# Patient Record
Sex: Female | Born: 1965 | Race: Asian | Hispanic: No | Smoking: Never smoker
Health system: Southern US, Community
[De-identification: ages and names within clinical notes are randomized; demographics above are authoritative.]

## PROBLEM LIST (undated history)

## (undated) DIAGNOSIS — R0602 Shortness of breath: Secondary | ICD-10-CM

## (undated) DIAGNOSIS — M1990 Unspecified osteoarthritis, unspecified site: Secondary | ICD-10-CM

## (undated) DIAGNOSIS — T783XXA Angioneurotic edema, initial encounter: Secondary | ICD-10-CM

## (undated) DIAGNOSIS — L509 Urticaria, unspecified: Secondary | ICD-10-CM

## (undated) DIAGNOSIS — Z789 Other specified health status: Secondary | ICD-10-CM

## (undated) HISTORY — PX: EYE SURGERY: SHX253

## (undated) HISTORY — PX: HAND SURGERY: SHX662

## (undated) HISTORY — DX: Urticaria, unspecified: L50.9

## (undated) HISTORY — DX: Angioneurotic edema, initial encounter: T78.3XXA

---

## 2006-06-04 ENCOUNTER — Emergency Department (HOSPITAL_COMMUNITY): Admission: EM | Admit: 2006-06-04 | Discharge: 2006-06-04 | Payer: Self-pay | Admitting: Emergency Medicine

## 2010-06-03 ENCOUNTER — Encounter: Admission: RE | Admit: 2010-06-03 | Discharge: 2010-06-03 | Payer: Self-pay | Admitting: Family Medicine

## 2010-06-26 ENCOUNTER — Encounter
Admission: RE | Admit: 2010-06-26 | Discharge: 2010-06-26 | Payer: Self-pay | Source: Home / Self Care | Attending: Family Medicine | Admitting: Family Medicine

## 2010-06-28 ENCOUNTER — Other Ambulatory Visit
Admission: RE | Admit: 2010-06-28 | Discharge: 2010-06-28 | Payer: Self-pay | Source: Home / Self Care | Admitting: Family Medicine

## 2011-05-07 ENCOUNTER — Other Ambulatory Visit: Payer: Self-pay | Admitting: Family Medicine

## 2011-05-07 DIAGNOSIS — N63 Unspecified lump in unspecified breast: Secondary | ICD-10-CM

## 2011-06-06 ENCOUNTER — Ambulatory Visit
Admission: RE | Admit: 2011-06-06 | Discharge: 2011-06-06 | Disposition: A | Discharge status: Home / Self Care | Payer: BC Managed Care – PPO | Source: Ambulatory Visit | Attending: Family Medicine | Admitting: Family Medicine

## 2011-06-06 DIAGNOSIS — N63 Unspecified lump in unspecified breast: Secondary | ICD-10-CM

## 2012-06-16 ENCOUNTER — Other Ambulatory Visit: Payer: Self-pay | Admitting: Family Medicine

## 2012-06-16 DIAGNOSIS — Z1231 Encounter for screening mammogram for malignant neoplasm of breast: Secondary | ICD-10-CM

## 2012-06-18 ENCOUNTER — Ambulatory Visit
Admission: RE | Admit: 2012-06-18 | Discharge: 2012-06-18 | Disposition: A | Discharge status: Home / Self Care | Payer: BC Managed Care – PPO | Source: Ambulatory Visit | Attending: Family Medicine | Admitting: Family Medicine

## 2012-06-18 DIAGNOSIS — Z1231 Encounter for screening mammogram for malignant neoplasm of breast: Secondary | ICD-10-CM

## 2013-08-02 ENCOUNTER — Other Ambulatory Visit: Payer: Self-pay

## 2013-08-02 DIAGNOSIS — Z1231 Encounter for screening mammogram for malignant neoplasm of breast: Secondary | ICD-10-CM

## 2013-08-05 ENCOUNTER — Ambulatory Visit
Admission: RE | Admit: 2013-08-05 | Discharge: 2013-08-05 | Disposition: A | Discharge status: Home / Self Care | Payer: BC Managed Care – PPO | Source: Ambulatory Visit

## 2013-08-05 DIAGNOSIS — Z1231 Encounter for screening mammogram for malignant neoplasm of breast: Secondary | ICD-10-CM

## 2013-11-28 ENCOUNTER — Other Ambulatory Visit: Payer: Self-pay | Admitting: Specialist

## 2013-11-28 DIAGNOSIS — M5030 Other cervical disc degeneration, unspecified cervical region: Secondary | ICD-10-CM

## 2013-11-29 ENCOUNTER — Other Ambulatory Visit: Payer: BC Managed Care – PPO

## 2013-12-02 ENCOUNTER — Ambulatory Visit
Admission: RE | Admit: 2013-12-02 | Discharge: 2013-12-02 | Disposition: A | Discharge status: Home / Self Care | Payer: BC Managed Care – PPO | Source: Ambulatory Visit | Attending: Specialist | Admitting: Specialist

## 2013-12-02 DIAGNOSIS — M5030 Other cervical disc degeneration, unspecified cervical region: Secondary | ICD-10-CM

## 2014-02-15 ENCOUNTER — Other Ambulatory Visit: Payer: Self-pay | Admitting: Neurological Surgery

## 2014-03-21 ENCOUNTER — Encounter (HOSPITAL_COMMUNITY): Payer: Self-pay

## 2014-03-21 ENCOUNTER — Other Ambulatory Visit (HOSPITAL_COMMUNITY): Payer: Self-pay | Admitting: *Deleted

## 2014-03-21 NOTE — Pre-Procedure Instructions (Signed)
Sara Reyes  03/21/2014   Your procedure is scheduled on:  Wednesday, March 29, 2014 at 9:30 AM.   Report to [facility] Entrance "A" Admitting Office at 7:30 AM.   Call this number if you have problems the morning of surgery: [phone]   Remember:   Do not eat food or drink liquids after midnight Tuesday, 03/28/14/   Take these medicines the morning of surgery with A SIP OF WATER: None  Stop Ibuprofen as of today.   Do not wear jewelry, make-up or nail polish.  Do not wear lotions, powders, or perfumes. You may wear deodorant.  Do not shave 48 hours prior to surgery.   Do not bring valuables to the hospital.  [facility] is not responsible                  for any belongings or valuables.               Contacts, dentures or bridgework may not be worn into surgery.  Leave suitcase in the car. After surgery it may be brought to your room.  For patients admitted to the hospital, discharge time is determined by your                treatment team.               Special Instructions: [facility] - Preparing for Surgery  Before surgery, you can play an important role.  Because skin is not sterile, your skin needs to be as free of germs as possible.  You can reduce the number of germs on you skin by washing with CHG (chlorahexidine gluconate) soap before surgery.  CHG is an antiseptic cleaner which kills germs and bonds with the skin to continue killing germs even after washing.  Please DO NOT use if you have an allergy to CHG or antibacterial soaps.  If your skin becomes reddened/irritated stop using the CHG and inform your nurse when you arrive at Short Stay.  Do not shave (including legs and underarms) for at least 48 hours prior to the first CHG shower.  You may shave your face.  Please follow these instructions carefully:   1.  Shower with CHG Soap the night before surgery and the                                morning of Surgery.  2.  If you choose to wash your  hair, wash your hair first as usual with your       normal shampoo.  3.  After you shampoo, rinse your hair and body thoroughly to remove the                      Shampoo.  4.  Use CHG as you would any other liquid soap.  You can apply chg directly       to the skin and wash gently with scrungie or a clean washcloth.  5.  Apply the CHG Soap to your body ONLY FROM THE NECK DOWN.        Do not use on open wounds or open sores.  Avoid contact with your eyes, ears, mouth and genitals (private parts).  Wash genitals (private parts) with your normal soap.  6.  Wash thoroughly, paying special attention to the area where your surgery        will be  performed.  7.  Thoroughly rinse your body with warm water from the neck down.  8.  DO NOT shower/wash with your normal soap after using and rinsing off       the CHG Soap.  9.  Pat yourself dry with a clean towel.            10.  Wear clean pajamas.            11.  Place clean sheets on your bed the night of your first shower and do not        sleep with pets.  Day of Surgery  Do not apply any lotions the morning of surgery.  Please wear clean clothes to the hospital/surgery center.     Please read over the following fact sheets that you were given: Pain Booklet, Coughing and Deep Breathing, MRSA Information and Surgical Site Infection Prevention

## 2014-03-22 ENCOUNTER — Encounter (HOSPITAL_COMMUNITY): Payer: Self-pay

## 2014-03-22 ENCOUNTER — Encounter (HOSPITAL_COMMUNITY)
Admission: RE | Admit: 2014-03-22 | Discharge: 2014-03-22 | Disposition: A | Discharge status: Home / Self Care | Payer: BC Managed Care – PPO | Source: Ambulatory Visit | Attending: Neurological Surgery | Admitting: Neurological Surgery

## 2014-03-22 DIAGNOSIS — Z01812 Encounter for preprocedural laboratory examination: Secondary | ICD-10-CM | POA: Diagnosis present

## 2014-03-22 DIAGNOSIS — M4802 Spinal stenosis, cervical region: Secondary | ICD-10-CM | POA: Diagnosis present

## 2014-03-22 HISTORY — DX: Shortness of breath: R06.02

## 2014-03-22 HISTORY — DX: Other specified health status: Z78.9

## 2014-03-22 HISTORY — DX: Unspecified osteoarthritis, unspecified site: M19.90

## 2014-03-22 LAB — CBC WITH DIFFERENTIAL/PLATELET
BASOS PCT: 1 % (ref 0–1)
Basophils Absolute: 0 10*3/uL (ref 0.0–0.1)
Eosinophils Absolute: 0.3 10*3/uL (ref 0.0–0.7)
Eosinophils Relative: 5 % (ref 0–5)
HCT: 35.1 % — ABNORMAL LOW (ref 36.0–46.0)
Hemoglobin: 11.4 g/dL — ABNORMAL LOW (ref 12.0–15.0)
Lymphocytes Relative: 29 % (ref 12–46)
Lymphs Abs: 1.9 10*3/uL (ref 0.7–4.0)
MCH: 27.1 pg (ref 26.0–34.0)
MCHC: 32.5 g/dL (ref 30.0–36.0)
MCV: 83.4 fL (ref 78.0–100.0)
Monocytes Absolute: 0.6 10*3/uL (ref 0.1–1.0)
Monocytes Relative: 8 % (ref 3–12)
NEUTROS PCT: 57 % (ref 43–77)
Neutro Abs: 3.8 10*3/uL (ref 1.7–7.7)
PLATELETS: 326 10*3/uL (ref 150–400)
RBC: 4.21 MIL/uL (ref 3.87–5.11)
RDW: 13.1 % (ref 11.5–15.5)
WBC: 6.6 10*3/uL (ref 4.0–10.5)

## 2014-03-22 LAB — PROTIME-INR
INR: 1.05 (ref 0.00–1.49)
Prothrombin Time: 13.7 seconds (ref 11.6–15.2)

## 2014-03-22 LAB — SURGICAL PCR SCREEN
MRSA, PCR: NEGATIVE
STAPHYLOCOCCUS AUREUS: POSITIVE — AB

## 2014-03-22 LAB — BASIC METABOLIC PANEL
Anion gap: 10 (ref 5–15)
BUN: 15 mg/dL (ref 6–23)
CO2: 25 mEq/L (ref 19–32)
CREATININE: 0.51 mg/dL (ref 0.50–1.10)
Calcium: 9.1 mg/dL (ref 8.4–10.5)
Chloride: 104 mEq/L (ref 96–112)
Glucose, Bld: 82 mg/dL (ref 70–99)
POTASSIUM: 4.6 meq/L (ref 3.7–5.3)
Sodium: 139 mEq/L (ref 137–147)

## 2014-03-22 LAB — HCG, SERUM, QUALITATIVE: Preg, Serum: NEGATIVE

## 2014-03-22 NOTE — Progress Notes (Signed)
Rx. Called to [facility] on [address].

## 2014-03-28 MED ORDER — DEXAMETHASONE SODIUM PHOSPHATE 10 MG/ML IJ SOLN
10.0000 mg | INTRAMUSCULAR | Status: AC
  Administered 2014-03-29: 10 mg via INTRAVENOUS
  Filled 2014-03-28: qty 1

## 2014-03-29 ENCOUNTER — Encounter (HOSPITAL_COMMUNITY): Payer: Self-pay | Admitting: *Deleted

## 2014-03-29 ENCOUNTER — Encounter (HOSPITAL_COMMUNITY)
Admission: RE | Disposition: A | Discharge status: Home / Self Care | Payer: Self-pay | Source: Ambulatory Visit | Attending: Neurological Surgery

## 2014-03-29 ENCOUNTER — Encounter (HOSPITAL_COMMUNITY): Payer: BC Managed Care – PPO | Admitting: Anesthesiology

## 2014-03-29 ENCOUNTER — Inpatient Hospital Stay (HOSPITAL_COMMUNITY): Payer: BC Managed Care – PPO

## 2014-03-29 ENCOUNTER — Inpatient Hospital Stay (HOSPITAL_COMMUNITY)
Admission: RE | Admit: 2014-03-29 | Discharge: 2014-04-03 | DRG: 473 | Disposition: A | Discharge status: Home / Self Care | Payer: BC Managed Care – PPO | Source: Ambulatory Visit | Attending: Neurological Surgery | Admitting: Neurological Surgery

## 2014-03-29 ENCOUNTER — Inpatient Hospital Stay (HOSPITAL_COMMUNITY): Payer: BC Managed Care – PPO | Admitting: Anesthesiology

## 2014-03-29 DIAGNOSIS — M47812 Spondylosis without myelopathy or radiculopathy, cervical region: Principal | ICD-10-CM | POA: Diagnosis present

## 2014-03-29 DIAGNOSIS — M488X2 Other specified spondylopathies, cervical region: Secondary | ICD-10-CM | POA: Diagnosis present

## 2014-03-29 DIAGNOSIS — M5020 Other cervical disc displacement, unspecified cervical region: Secondary | ICD-10-CM | POA: Diagnosis present

## 2014-03-29 DIAGNOSIS — Z981 Arthrodesis status: Secondary | ICD-10-CM

## 2014-03-29 DIAGNOSIS — M79609 Pain in unspecified limb: Secondary | ICD-10-CM | POA: Diagnosis present

## 2014-03-29 HISTORY — PX: POSTERIOR CERVICAL FUSION/FORAMINOTOMY: SHX5038

## 2014-03-29 SURGERY — POSTERIOR CERVICAL FUSION/FORAMINOTOMY LEVEL 5
Anesthesia: General

## 2014-03-29 MED ORDER — ACETAMINOPHEN 325 MG PO TABS: 650.0000 mg | ORAL_TABLET | ORAL | Status: DC | PRN

## 2014-03-29 MED ORDER — NEOSTIGMINE METHYLSULFATE 10 MG/10ML IV SOLN
INTRAVENOUS | Status: AC
  Filled 2014-03-29: qty 1

## 2014-03-29 MED ORDER — EPHEDRINE SULFATE 50 MG/ML IJ SOLN
INTRAMUSCULAR | Status: AC
Start: 2014-03-29 — End: 2014-03-29
  Filled 2014-03-29: qty 1

## 2014-03-29 MED ORDER — HYDROMORPHONE HCL 1 MG/ML IJ SOLN
INTRAMUSCULAR | Status: AC
  Filled 2014-03-29: qty 1

## 2014-03-29 MED ORDER — STERILE WATER FOR INJECTION IJ SOLN
INTRAMUSCULAR | Status: AC
  Filled 2014-03-29: qty 10

## 2014-03-29 MED ORDER — WHITE PETROLATUM GEL
Status: AC
  Administered 2014-03-29: 18:00:00
  Filled 2014-03-29: qty 5

## 2014-03-29 MED ORDER — POTASSIUM CHLORIDE IN NACL 20-0.9 MEQ/L-% IV SOLN
INTRAVENOUS | Status: DC
  Administered 2014-03-29: 20:00:00 via INTRAVENOUS
  Administered 2014-03-30: 75 mL/h via INTRAVENOUS
  Administered 2014-03-31 – 2014-04-01 (×3): via INTRAVENOUS
  Filled 2014-03-29 (×5): qty 1000

## 2014-03-29 MED ORDER — VECURONIUM BROMIDE 10 MG IV SOLR
INTRAVENOUS | Status: AC
  Filled 2014-03-29: qty 10

## 2014-03-29 MED ORDER — GLYCOPYRROLATE 0.2 MG/ML IJ SOLN
INTRAMUSCULAR | Status: AC
  Filled 2014-03-29: qty 2

## 2014-03-29 MED ORDER — OXYCODONE HCL 5 MG PO TABS
5.0000 mg | ORAL_TABLET | Freq: Once | ORAL | Status: AC | PRN
  Administered 2014-03-29: 5 mg via ORAL

## 2014-03-29 MED ORDER — GLYCOPYRROLATE 0.2 MG/ML IJ SOLN
INTRAMUSCULAR | Status: DC | PRN
  Administered 2014-03-29: 0.2 mg via INTRAVENOUS
  Administered 2014-03-29: 0.1 mg via INTRAVENOUS

## 2014-03-29 MED ORDER — SODIUM CHLORIDE 0.9 % IJ SOLN
INTRAMUSCULAR | Status: AC
  Filled 2014-03-29: qty 10

## 2014-03-29 MED ORDER — VECURONIUM BROMIDE 10 MG IV SOLR
INTRAVENOUS | Status: DC | PRN
  Administered 2014-03-29: 1 mg via INTRAVENOUS
  Administered 2014-03-29: 2 mg via INTRAVENOUS

## 2014-03-29 MED ORDER — KETOROLAC TROMETHAMINE 30 MG/ML IJ SOLN
INTRAMUSCULAR | Status: AC
  Administered 2014-03-29: 15 mg
  Filled 2014-03-29: qty 1

## 2014-03-29 MED ORDER — GLYCOPYRROLATE 0.2 MG/ML IJ SOLN
INTRAMUSCULAR | Status: AC
  Filled 2014-03-29: qty 1

## 2014-03-29 MED ORDER — ACETAMINOPHEN 650 MG RE SUPP
650.0000 mg | RECTAL | Status: DC | PRN
Start: 2014-03-29 — End: 2014-04-03

## 2014-03-29 MED ORDER — LACTATED RINGERS IV SOLN
INTRAVENOUS | Status: DC | PRN
  Administered 2014-03-29 (×2): via INTRAVENOUS

## 2014-03-29 MED ORDER — FENTANYL CITRATE 0.05 MG/ML IJ SOLN
INTRAMUSCULAR | Status: AC
  Filled 2014-03-29: qty 5

## 2014-03-29 MED ORDER — OXYCODONE-ACETAMINOPHEN 5-325 MG PO TABS: 1.0000 | ORAL_TABLET | ORAL | Status: DC | PRN

## 2014-03-29 MED ORDER — OXYCODONE HCL 5 MG PO TABS
ORAL_TABLET | ORAL | Status: AC
  Filled 2014-03-29: qty 1

## 2014-03-29 MED ORDER — SUCCINYLCHOLINE CHLORIDE 20 MG/ML IJ SOLN
INTRAMUSCULAR | Status: DC | PRN
  Administered 2014-03-29: 120 mg via INTRAVENOUS

## 2014-03-29 MED ORDER — METHOCARBAMOL 500 MG PO TABS
500.0000 mg | ORAL_TABLET | Freq: Four times a day (QID) | ORAL | Status: DC | PRN
  Administered 2014-03-29 – 2014-04-03 (×7): 500 mg via ORAL
  Filled 2014-03-29 (×7): qty 1

## 2014-03-29 MED ORDER — THROMBIN 20000 UNITS EX SOLR
CUTANEOUS | Status: DC | PRN
  Administered 2014-03-29: 11:00:00 via TOPICAL

## 2014-03-29 MED ORDER — METHOCARBAMOL 500 MG PO TABS
ORAL_TABLET | ORAL | Status: AC
  Filled 2014-03-29: qty 1

## 2014-03-29 MED ORDER — ONDANSETRON HCL 4 MG/2ML IJ SOLN: 4.0000 mg | Freq: Once | INTRAMUSCULAR | Status: DC | PRN

## 2014-03-29 MED ORDER — CEFAZOLIN SODIUM-DEXTROSE 2-3 GM-% IV SOLR
INTRAVENOUS | Status: AC
  Filled 2014-03-29: qty 50

## 2014-03-29 MED ORDER — OXYCODONE HCL 5 MG/5ML PO SOLN: 5.0000 mg | Freq: Once | ORAL | Status: AC | PRN

## 2014-03-29 MED ORDER — SODIUM CHLORIDE 0.9 % IJ SOLN
3.0000 mL | Freq: Two times a day (BID) | INTRAMUSCULAR | Status: DC
  Administered 2014-03-30 – 2014-04-02 (×4): 3 mL via INTRAVENOUS

## 2014-03-29 MED ORDER — 0.9 % SODIUM CHLORIDE (POUR BTL) OPTIME
TOPICAL | Status: DC | PRN
  Administered 2014-03-29: 1000 mL

## 2014-03-29 MED ORDER — SODIUM CHLORIDE 0.9 % IJ SOLN: 3.0000 mL | INTRAMUSCULAR | Status: DC | PRN

## 2014-03-29 MED ORDER — PHENYLEPHRINE HCL 10 MG/ML IJ SOLN
10.0000 mg | INTRAVENOUS | Status: DC | PRN
  Administered 2014-03-29: 20 ug/min via INTRAVENOUS

## 2014-03-29 MED ORDER — MIDAZOLAM HCL 5 MG/5ML IJ SOLN
INTRAMUSCULAR | Status: DC | PRN
  Administered 2014-03-29: 2 mg via INTRAVENOUS

## 2014-03-29 MED ORDER — ROCURONIUM BROMIDE 50 MG/5ML IV SOLN
INTRAVENOUS | Status: AC
  Filled 2014-03-29: qty 1

## 2014-03-29 MED ORDER — PROPOFOL 10 MG/ML IV BOLUS
INTRAVENOUS | Status: DC | PRN
  Administered 2014-03-29: 20 mg via INTRAVENOUS
  Administered 2014-03-29: 180 mg via INTRAVENOUS
  Administered 2014-03-29: 100 mg via INTRAVENOUS

## 2014-03-29 MED ORDER — PHENOL 1.4 % MT LIQD
1.0000 | OROMUCOSAL | Status: DC | PRN
  Administered 2014-03-29: 1 via OROMUCOSAL
  Filled 2014-03-29: qty 177

## 2014-03-29 MED ORDER — SODIUM CHLORIDE 0.9 % IV SOLN: 250.0000 mL | INTRAVENOUS | Status: DC

## 2014-03-29 MED ORDER — ONDANSETRON HCL 4 MG/2ML IJ SOLN
INTRAMUSCULAR | Status: DC | PRN
Start: 2014-03-29 — End: 2014-03-29
  Administered 2014-03-29: 4 mg via INTRAVENOUS

## 2014-03-29 MED ORDER — METOPROLOL TARTRATE 1 MG/ML IV SOLN
INTRAVENOUS | Status: DC | PRN
  Administered 2014-03-29: 2 mg via INTRAVENOUS

## 2014-03-29 MED ORDER — CEFAZOLIN SODIUM-DEXTROSE 2-3 GM-% IV SOLR: 2.0000 g | INTRAVENOUS | Status: DC

## 2014-03-29 MED ORDER — LIDOCAINE HCL (CARDIAC) 20 MG/ML IV SOLN
INTRAVENOUS | Status: AC
  Filled 2014-03-29: qty 5

## 2014-03-29 MED ORDER — DEXAMETHASONE 4 MG PO TABS
4.0000 mg | ORAL_TABLET | Freq: Four times a day (QID) | ORAL | Status: DC
  Administered 2014-04-01 – 2014-04-03 (×6): 4 mg via ORAL
  Filled 2014-03-29 (×7): qty 1

## 2014-03-29 MED ORDER — DEXTROSE 5 % IV SOLN
INTRAVENOUS | Status: DC | PRN
  Administered 2014-03-29: 10:00:00 via INTRAVENOUS

## 2014-03-29 MED ORDER — DEXAMETHASONE SODIUM PHOSPHATE 4 MG/ML IJ SOLN
4.0000 mg | Freq: Four times a day (QID) | INTRAMUSCULAR | Status: DC
  Administered 2014-03-29 – 2014-04-01 (×13): 4 mg via INTRAVENOUS
  Filled 2014-03-29 (×13): qty 1

## 2014-03-29 MED ORDER — MIDAZOLAM HCL 2 MG/2ML IJ SOLN
INTRAMUSCULAR | Status: AC
  Filled 2014-03-29: qty 2

## 2014-03-29 MED ORDER — THROMBIN 5000 UNITS EX SOLR
OROMUCOSAL | Status: DC | PRN
  Administered 2014-03-29 (×2): via TOPICAL

## 2014-03-29 MED ORDER — HYDROMORPHONE HCL PF 1 MG/ML IJ SOLN
0.2500 mg | INTRAMUSCULAR | Status: DC | PRN
Start: 2014-03-29 — End: 2014-03-29
  Administered 2014-03-29 (×4): 0.5 mg via INTRAVENOUS

## 2014-03-29 MED ORDER — LIDOCAINE HCL (CARDIAC) 20 MG/ML IV SOLN
INTRAVENOUS | Status: DC | PRN
  Administered 2014-03-29: 40 mg via INTRAVENOUS

## 2014-03-29 MED ORDER — BACITRACIN 50000 UNITS IM SOLR
INTRAMUSCULAR | Status: DC | PRN
  Administered 2014-03-29: 11:00:00

## 2014-03-29 MED ORDER — HYDROMORPHONE HCL 1 MG/ML IJ SOLN
INTRAMUSCULAR | Status: AC
  Administered 2014-03-29: 0.5 mg via INTRAVENOUS
  Filled 2014-03-29: qty 1

## 2014-03-29 MED ORDER — PROPOFOL 10 MG/ML IV BOLUS
INTRAVENOUS | Status: AC
  Filled 2014-03-29: qty 20

## 2014-03-29 MED ORDER — FENTANYL CITRATE 0.05 MG/ML IJ SOLN
INTRAMUSCULAR | Status: DC | PRN
  Administered 2014-03-29 (×8): 50 ug via INTRAVENOUS
  Administered 2014-03-29: 100 ug via INTRAVENOUS

## 2014-03-29 MED ORDER — LACTATED RINGERS IV SOLN
INTRAVENOUS | Status: DC
  Administered 2014-03-29: 08:00:00 via INTRAVENOUS

## 2014-03-29 MED ORDER — VANCOMYCIN HCL 10 G IV SOLR
1500.0000 mg | INTRAVENOUS | Status: DC
  Administered 2014-03-30 – 2014-04-02 (×4): 1500 mg via INTRAVENOUS
  Filled 2014-03-29 (×5): qty 1500

## 2014-03-29 MED ORDER — KETOROLAC TROMETHAMINE 15 MG/ML IJ SOLN: 15.0000 mg | Freq: Once | INTRAMUSCULAR | Status: DC

## 2014-03-29 MED ORDER — LACTATED RINGERS IV SOLN
INTRAVENOUS | Status: DC | PRN
  Administered 2014-03-29: 10:00:00 via INTRAVENOUS

## 2014-03-29 MED ORDER — PROPOFOL INFUSION 10 MG/ML OPTIME
INTRAVENOUS | Status: DC | PRN
  Administered 2014-03-29: 50 ug/kg/min via INTRAVENOUS

## 2014-03-29 MED ORDER — PHENYLEPHRINE 40 MCG/ML (10ML) SYRINGE FOR IV PUSH (FOR BLOOD PRESSURE SUPPORT)
PREFILLED_SYRINGE | INTRAVENOUS | Status: AC
  Filled 2014-03-29: qty 10

## 2014-03-29 MED ORDER — SUCCINYLCHOLINE CHLORIDE 20 MG/ML IJ SOLN
INTRAMUSCULAR | Status: AC
  Filled 2014-03-29: qty 1

## 2014-03-29 MED ORDER — ONDANSETRON HCL 4 MG/2ML IJ SOLN
INTRAMUSCULAR | Status: AC
  Filled 2014-03-29: qty 2

## 2014-03-29 MED ORDER — METHOCARBAMOL 1000 MG/10ML IJ SOLN
500.0000 mg | Freq: Four times a day (QID) | INTRAVENOUS | Status: DC | PRN
  Administered 2014-03-30 – 2014-04-01 (×7): 500 mg via INTRAVENOUS
  Filled 2014-03-29 (×8): qty 5

## 2014-03-29 MED ORDER — VANCOMYCIN HCL IN DEXTROSE 1-5 GM/200ML-% IV SOLN
INTRAVENOUS | Status: AC
  Administered 2014-03-29: 1000 mg via INTRAVENOUS
  Filled 2014-03-29: qty 200

## 2014-03-29 MED ORDER — MENTHOL 3 MG MT LOZG
1.0000 | LOZENGE | OROMUCOSAL | Status: DC | PRN
  Filled 2014-03-29: qty 9

## 2014-03-29 MED ORDER — MORPHINE SULFATE 2 MG/ML IJ SOLN
1.0000 mg | INTRAMUSCULAR | Status: DC | PRN
  Administered 2014-03-29 (×2): 2 mg via INTRAVENOUS
  Administered 2014-03-30 (×2): 3 mg via INTRAVENOUS
  Administered 2014-03-30 (×2): 4 mg via INTRAVENOUS
  Administered 2014-03-30: 3 mg via INTRAVENOUS
  Administered 2014-03-30: 2 mg via INTRAVENOUS
  Administered 2014-03-30 – 2014-04-01 (×6): 4 mg via INTRAVENOUS
  Administered 2014-04-01 – 2014-04-02 (×2): 2 mg via INTRAVENOUS
  Filled 2014-03-29 (×3): qty 2
  Filled 2014-03-29: qty 1
  Filled 2014-03-29: qty 2
  Filled 2014-03-29: qty 1
  Filled 2014-03-29: qty 2
  Filled 2014-03-29: qty 1
  Filled 2014-03-29 (×2): qty 2
  Filled 2014-03-29: qty 1
  Filled 2014-03-29: qty 2
  Filled 2014-03-29 (×2): qty 1
  Filled 2014-03-29 (×3): qty 2

## 2014-03-29 MED ORDER — BUPIVACAINE HCL (PF) 0.25 % IJ SOLN
INTRAMUSCULAR | Status: DC | PRN
  Administered 2014-03-29: 10 mL

## 2014-03-29 MED ORDER — ONDANSETRON HCL 4 MG/2ML IJ SOLN: 4.0000 mg | INTRAMUSCULAR | Status: DC | PRN

## 2014-03-29 MED ORDER — NEOSTIGMINE METHYLSULFATE 10 MG/10ML IV SOLN
INTRAVENOUS | Status: DC | PRN
  Administered 2014-03-29: 2 mg via INTRAVENOUS

## 2014-03-29 MED ORDER — ACETAMINOPHEN 10 MG/ML IV SOLN
INTRAVENOUS | Status: AC
  Administered 2014-03-29: 1000 mg via INTRAVENOUS
  Filled 2014-03-29: qty 100

## 2014-03-29 MED ORDER — ZOLPIDEM TARTRATE 5 MG PO TABS
5.0000 mg | ORAL_TABLET | Freq: Every evening | ORAL | Status: DC | PRN
  Administered 2014-03-30 – 2014-04-02 (×4): 5 mg via ORAL
  Filled 2014-03-29 (×4): qty 1

## 2014-03-29 SURGICAL SUPPLY — 68 items
APL SKNCLS STERI-STRIP NONHPOA (GAUZE/BANDAGES/DRESSINGS) ×1
BAG DECANTER FOR FLEXI CONT (MISCELLANEOUS) ×3 IMPLANT
BENZOIN TINCTURE PRP APPL 2/3 (GAUZE/BANDAGES/DRESSINGS) ×3 IMPLANT
BIT DRILL VUEPOINT II (BIT) IMPLANT
BLADE SURG ROTATE 9660 (MISCELLANEOUS) IMPLANT
BONE MATRIX OSTEOCEL PRO MED (Bone Implant) ×2 IMPLANT
BUR MATCHSTICK NEURO 3.0 LAGG (BURR) ×3 IMPLANT
CANISTER SUCT 3000ML (MISCELLANEOUS) ×3 IMPLANT
CLOSURE WOUND 1/2 X4 (GAUZE/BANDAGES/DRESSINGS) ×1
CONT SPEC 4OZ CLIKSEAL STRL BL (MISCELLANEOUS) ×3 IMPLANT
DRAPE C-ARM 42X72 X-RAY (DRAPES) ×6 IMPLANT
DRAPE LAPAROTOMY 100X72 PEDS (DRAPES) ×3 IMPLANT
DRAPE POUCH INSTRU U-SHP 10X18 (DRAPES) ×3 IMPLANT
DRILL BIT VUEPOINT II (BIT) ×3
DRSG OPSITE 4X5.5 SM (GAUZE/BANDAGES/DRESSINGS) ×4 IMPLANT
DRSG OPSITE POSTOP 3X4 (GAUZE/BANDAGES/DRESSINGS) ×2 IMPLANT
DRSG TELFA 3X8 NADH (GAUZE/BANDAGES/DRESSINGS) ×3 IMPLANT
DURAPREP 6ML APPLICATOR 50/CS (WOUND CARE) ×3 IMPLANT
ELECT REM PT RETURN 9FT ADLT (ELECTROSURGICAL) ×3
ELECTRODE REM PT RTRN 9FT ADLT (ELECTROSURGICAL) ×1 IMPLANT
EVACUATOR 1/8 PVC DRAIN (DRAIN) ×3 IMPLANT
GAUZE SPONGE 4X4 12PLY STRL (GAUZE/BANDAGES/DRESSINGS) ×3 IMPLANT
GAUZE SPONGE 4X4 16PLY XRAY LF (GAUZE/BANDAGES/DRESSINGS) IMPLANT
GLOVE BIO SURGEON STRL SZ8 (GLOVE) ×3 IMPLANT
GLOVE BIOGEL PI IND STRL 7.0 (GLOVE) IMPLANT
GLOVE BIOGEL PI INDICATOR 7.0 (GLOVE) ×10
GLOVE EXAM NITRILE LRG STRL (GLOVE) IMPLANT
GLOVE EXAM NITRILE MD LF STRL (GLOVE) IMPLANT
GLOVE EXAM NITRILE XL STR (GLOVE) IMPLANT
GLOVE EXAM NITRILE XS STR PU (GLOVE) IMPLANT
GLOVE INDICATOR 8.5 STRL (GLOVE) ×2 IMPLANT
GLOVE SURG SS PI 7.0 STRL IVOR (GLOVE) ×10 IMPLANT
GOWN STRL REUS W/ TWL LRG LVL3 (GOWN DISPOSABLE) IMPLANT
GOWN STRL REUS W/ TWL XL LVL3 (GOWN DISPOSABLE) ×1 IMPLANT
GOWN STRL REUS W/TWL 2XL LVL3 (GOWN DISPOSABLE) IMPLANT
GOWN STRL REUS W/TWL LRG LVL3 (GOWN DISPOSABLE)
GOWN STRL REUS W/TWL XL LVL3 (GOWN DISPOSABLE) ×12
HEMOSTAT POWDER KIT SURGIFOAM (HEMOSTASIS) IMPLANT
KIT BASIN OR (CUSTOM PROCEDURE TRAY) ×3 IMPLANT
KIT ROOM TURNOVER OR (KITS) ×3 IMPLANT
MARKER SKIN DUAL TIP RULER LAB (MISCELLANEOUS) ×3 IMPLANT
MILL MEDIUM DISP (BLADE) ×2 IMPLANT
NDL HYPO 25X1 1.5 SAFETY (NEEDLE) ×1 IMPLANT
NEEDLE HYPO 18GX1.5 BLUNT FILL (NEEDLE) IMPLANT
NEEDLE HYPO 25X1 1.5 SAFETY (NEEDLE) ×3 IMPLANT
NEEDLE SPNL 20GX3.5 QUINCKE YW (NEEDLE) ×3 IMPLANT
NEURO MONITORING STIM (LABOR (TRAVEL & OVERTIME)) ×2 IMPLANT
NS IRRIG 1000ML POUR BTL (IV SOLUTION) ×3 IMPLANT
PACK LAMINECTOMY NEURO (CUSTOM PROCEDURE TRAY) ×3 IMPLANT
PIN MAYFIELD SKULL DISP (PIN) ×3 IMPLANT
ROD VUEPOINT 80MM (Rod) ×4 IMPLANT
SCREW MA MM 3.5X14 (Screw) ×8 IMPLANT
SCREW SET THREADED (Screw) ×24 IMPLANT
SCREW VUEPOINT 3.5X16MM (Screw) ×4 IMPLANT
SCREW VUEPOINT II 3.5X12 FA (Screw) ×12 IMPLANT
SPONGE SURGIFOAM ABS GEL 100 (HEMOSTASIS) ×7 IMPLANT
STRIP CLOSURE SKIN 1/2X4 (GAUZE/BANDAGES/DRESSINGS) ×2 IMPLANT
SUT VIC AB 0 CT1 18XCR BRD8 (SUTURE) ×1 IMPLANT
SUT VIC AB 0 CT1 8-18 (SUTURE) ×6
SUT VIC AB 2-0 CP2 18 (SUTURE) ×3 IMPLANT
SUT VIC AB 3-0 SH 8-18 (SUTURE) IMPLANT
SYR 20ML ECCENTRIC (SYRINGE) ×3 IMPLANT
TOWEL OR 17X24 6PK STRL BLUE (TOWEL DISPOSABLE) ×3 IMPLANT
TOWEL OR 17X26 10 PK STRL BLUE (TOWEL DISPOSABLE) ×3 IMPLANT
TRAY FOLEY CATH 14FRSI W/METER (CATHETERS) IMPLANT
TRAY FOLEY CATH 16FRSI W/METER (SET/KITS/TRAYS/PACK) IMPLANT
UNDERPAD 30X30 INCONTINENT (UNDERPADS AND DIAPERS) ×3 IMPLANT
WATER STERILE IRR 1000ML POUR (IV SOLUTION) ×3 IMPLANT

## 2014-03-29 NOTE — Anesthesia Procedure Notes (Addendum)
Procedure Name: Intubation Date/Time: 03/29/2014 9:45 AM Performed by: Lovie Chol Pre-anesthesia Checklist: Patient identified, Emergency Drugs available, Suction available, Patient being monitored and Timeout performed Patient Re-evaluated:Patient Re-evaluated prior to inductionOxygen Delivery Method: Circle system utilized Preoxygenation: Pre-oxygenation with 100% oxygen Intubation Type: IV induction Ventilation: Mask ventilation without difficulty Grade View: Grade I Tube type: Oral Tube size: 7.0 mm Number of attempts: 1 Airway Equipment and Method: Stylet and Video-laryngoscopy Placement Confirmation: positive ETCO2,  CO2 detector and breath sounds checked- equal and bilateral Secured at: 21 cm Tube secured with: Tape Dental Injury: Teeth and Oropharynx as per pre-operative assessment  Difficulty Due To: Difficult Airway-  due to neck instability Comments: Smooth Iv induction. EZ mask. Smooth video-glide intubation. Cervical spine maintained in neutral position throughout induction and intubation.

## 2014-03-29 NOTE — Transfer of Care (Signed)
Immediate Anesthesia Transfer of Care Note  Patient: Sara Reyes  Procedure(s) Performed: Procedure(s): CERVICAL TWO THRU CERVICAL SEVEN Posterior Cervical Fusion w/lateral mass fixation, CERVICAL THREE THRU CERVICAL SEVEN Cervical Laminectomy (N/A)  Patient Location: PACU  Anesthesia Type:General  Level of Consciousness: awake, oriented and patient cooperative  Airway & Oxygen Therapy: Patient Spontanous Breathing and Patient connected to nasal cannula oxygen  Post-op Assessment: Report given to PACU RN and Post -op Vital signs reviewed and stable  Post vital signs: Reviewed  Complications: No apparent anesthesia complications

## 2014-03-29 NOTE — Anesthesia Preprocedure Evaluation (Signed)
Anesthesia Evaluation  Patient identified by MRN, date of birth, ID band Patient awake    Reviewed: Allergy & Precautions, H&P , NPO status , Patient's Chart, lab work & pertinent test results  Airway Mallampati: II TM Distance: >3 FB Neck ROM: Full    Dental  (+) Teeth Intact, Dental Advisory Given   Pulmonary  breath sounds clear to auscultation        Cardiovascular Rhythm:Regular Rate:Normal     Neuro/Psych    GI/Hepatic   Endo/Other    Renal/GU      Musculoskeletal   Abdominal   Peds  Hematology   Anesthesia Other Findings   Reproductive/Obstetrics                           Anesthesia Physical Anesthesia Plan  ASA: II  Anesthesia Plan: General   Post-op Pain Management:    Induction: Intravenous  Airway Management Planned: Oral ETT  Additional Equipment:   Intra-op Plan:   Post-operative Plan: Extubation in OR  Informed Consent: I have reviewed the patients History and Physical, chart, labs and discussed the procedure including the risks, benefits and alternatives for the proposed anesthesia with the patient or authorized representative who has indicated his/her understanding and acceptance.   Dental advisory given  Plan Discussed with: CRNA and Anesthesiologist  Anesthesia Plan Comments: (Cervical spondylosis  Plan GA with oral ETT  Sara Reyes)        Anesthesia Quick Evaluation

## 2014-03-29 NOTE — Progress Notes (Addendum)
1810 - pt attempted to swallow water but stated difficulty and throat "not feeling right." swallow screen performed but pt stated cannot take sips of water due to neck pain.  placed pt on NPO diet. Will closely monitor  Andrew Au I 03/29/2014 6:18 PM

## 2014-03-29 NOTE — Progress Notes (Signed)
ANTIBIOTIC CONSULT NOTE - INITIAL  Pharmacy Consult for vancomycin Indication: surgical prophylaxis  Allergies  Allergen Reactions  . Penicillins Rash  . Sulfa Antibiotics Rash    Patient Measurements: Height: 5' (152.4 cm) Weight: 133 lb (60.328 kg) IBW/kg (Calculated) : 45.5  Vital Signs: Temp: 98.1 F (36.7 C) (09/16 1657) Temp src: Oral (09/16 1657) BP: 133/77 mmHg (09/16 1657) Pulse Rate: 92 (09/16 1657) Intake/Output from previous day:   Intake/Output from this shift: Total I/O In: 2500 [I.V.:2500] Out: 600 [Urine:400; Blood:200]  Labs: No results found for this basename: WBC, HGB, PLT, LABCREA, CREATININE,  in the last 72 hours Estimated Creatinine Clearance: 68.3 ml/min (by C-G formula based on Cr of 0.51). No results found for this basename: VANCOTROUGH, Leodis Binet, VANCORANDOM, GENTTROUGH, GENTPEAK, GENTRANDOM, TOBRATROUGH, TOBRAPEAK, TOBRARND, AMIKACINPEAK, AMIKACINTROU, AMIKACIN,  in the last 72 hours   Microbiology: Recent Results (from the past 720 hour(s))  SURGICAL PCR SCREEN     Status: Abnormal   Collection Time    03/22/14  8:50 AM      Result Value Ref Range Status   MRSA, PCR NEGATIVE  NEGATIVE Final   Staphylococcus aureus POSITIVE (*) NEGATIVE Final   Comment:            The Xpert SA Assay (FDA     approved for NASAL specimens     in patients over 68 years of age),     is one component of     a comprehensive surveillance     program.  Test performance has     been validated by [employer] for patients greater     than or equal to 48 year old.     It is not intended     to diagnose infection nor to     guide or monitor treatment.    Medical History: Past Medical History  Diagnosis Date  . Shortness of breath     with exertion  . Arthritis   . Medical history non-contributory     Medications:  Prescriptions prior to admission  Medication Sig Dispense Refill  . ibuprofen (ADVIL,MOTRIN) 200 MG tablet Take 800 mg by mouth daily  as needed (pain).       Assessment: 48 y/o female s/p cervical laminectomy with medium hemovac drain placed. Pharmacy consulted to begin vancomycin for post-op prophylaxis. Pre-op renal function was normal and he is currently afebrile. He was given vancomycin 1000 mg IV pre-op at 11:02.  Goal of Therapy:  Vancomycin trough level 10-15 mcg/ml  Plan:  - Vancomycin 1500 mg IV q24h - Monitor renal function and clinical course - Dr. Yetta Barre, the vancomycin has no stop date since there is a drain in place; please indicate planned LOT  [facility], [place].D., BCPS Clinical Pharmacist Pager: [phone] 03/29/2014 4:59 PM

## 2014-03-29 NOTE — Anesthesia Postprocedure Evaluation (Signed)
  Anesthesia Post-op Note  Patient: Sara Reyes  Procedure(s) Performed: Procedure(s): CERVICAL TWO THRU CERVICAL SEVEN Posterior Cervical Fusion w/lateral mass fixation, CERVICAL THREE THRU CERVICAL SEVEN Cervical Laminectomy (N/A)  Patient Location: PACU  Anesthesia Type:General  Level of Consciousness: awake, alert  and oriented  Airway and Oxygen Therapy: Patient Spontanous Breathing and Patient connected to nasal cannula oxygen  Post-op Pain: mild  Post-op Assessment: Post-op Vital signs reviewed, Patient's Cardiovascular Status Stable, Respiratory Function Stable, Patent Airway and Pain level controlled  Post-op Vital Signs: stable  Last Vitals:  Filed Vitals:   03/29/14 1800  BP:   Pulse:   Temp: 36.7 C  Resp:     Complications: No apparent anesthesia complications

## 2014-03-29 NOTE — H&P (Signed)
Subjective:   Patient is a 48 y.o. female admitted for CL/PCF for OPLL. The patient first presented to me with complaints of arm pain and numbness of the arm(s). Onset of symptoms was a few months ago. The pain is described as aching and occurs intermittently. The pain is rated mild, and is located  In the neck. The symptoms have been progressive. Symptoms are exacerbated by extending head backwards, and are relieved by none.  Previous work up includes MRI of cervical spine, results: spinal stenosis and CT of cervical spine, results: spinal stenosis.  Past Medical History  Diagnosis Date  . Shortness of breath     with exertion  . Arthritis   . Medical history non-contributory     Past Surgical History  Procedure Laterality Date  . Hand surgery Right     nerve repair  . Eye surgery Bilateral     lasik    Allergies  Allergen Reactions  . Penicillins Rash  . Sulfa Antibiotics Rash    History  Substance Use Topics  . Smoking status: Never Smoker   . Smokeless tobacco: Not on file  . Alcohol Use: Yes     Comment: socially    History reviewed. No pertinent family history. Prior to Admission medications   Medication Sig Start Date End Date Taking? Authorizing Provider  ibuprofen (ADVIL,MOTRIN) 200 MG tablet Take 800 mg by mouth daily as needed (pain).   Yes Historical Provider, MD     Review of Systems  Positive ROS: neg  All other systems have been reviewed and were otherwise negative with the exception of those mentioned in the HPI and as above.  Objective: Vital signs in last 24 hours: Temp:  [98.1 F (36.7 C)] 98.1 F (36.7 C) (09/16 0745) Pulse Rate:  [69] 69 (09/16 0745) Resp:  [20] 20 (09/16 0745) BP: (126)/(69) 126/69 mmHg (09/16 0745) SpO2:  [100 %] 100 % (09/16 0745) Weight:  [60.328 kg (133 lb)] 60.328 kg (133 lb) (09/16 0745)  General Appearance: Alert, cooperative, no distress, appears stated age Head: Normocephalic, without obvious abnormality,  atraumatic Eyes: PERRL, conjunctiva/corneas clear, EOM's intact      Neck: Supple, symmetrical, trachea midline, Back: Symmetric, no curvature, ROM normal, no CVA tenderness Lungs:  respirations unlabored Heart: Regular rate and rhythm Abdomen: Soft, non-tender Extremities: Extremities normal, atraumatic, no cyanosis or edema Pulses: 2+ and symmetric all extremities Skin: Skin color, texture, turgor normal, no rashes or lesions  NEUROLOGIC:  Mental status: Alert and oriented x4, no aphasia, good attention span, fund of knowledge and memory  Motor Exam - grossly normal Sensory Exam - grossly normal Reflexes: 1+ Coordination - grossly normal Gait - grossly normal Balance - grossly normal Cranial Nerves: I: smell Not tested  II: visual acuity  OS: nl    OD: nl  II: visual fields Full to confrontation  II: pupils Equal, round, reactive to light  III,VII: ptosis None  III,IV,VI: extraocular muscles  Full ROM  V: mastication Normal  V: facial light touch sensation  Normal  V,VII: corneal reflex  Present  VII: facial muscle function - upper  Normal  VII: facial muscle function - lower Normal  VIII: hearing Not tested  IX: soft palate elevation  Normal  IX,X: gag reflex Present  XI: trapezius strength  5/5  XI: sternocleidomastoid strength 5/5  XI: neck flexion strength  5/5  XII: tongue strength  Normal    Data Review Lab Results  Component Value Date   WBC 6.6 03/22/2014  HGB 11.4* 03/22/2014   HCT 35.1* 03/22/2014   MCV 83.4 03/22/2014   PLT 326 03/22/2014   Lab Results  Component Value Date   NA 139 03/22/2014   K 4.6 03/22/2014   CL 104 03/22/2014   CO2 25 03/22/2014   BUN 15 03/22/2014   CREATININE 0.51 03/22/2014   GLUCOSE 82 03/22/2014   Lab Results  Component Value Date   INR 1.05 03/22/2014    Assessment:   Cervical neck pain with herniated nucleus pulposus/ spondylosis/ stenosis at C2-C7 with OPLL. Patient has failed conservative therapy. Planned surgery : cervical  laminectomy with posterior cervical fusion/ fixation C2-C7.  Plan:   I explained the condition and procedure to the patient and answered any questions.  Patient wishes to proceed with procedure as planned. Understands risks/ benefits/ and expected or typical outcomes.  Sara Reyes S 03/29/2014 9:28 AM

## 2014-03-29 NOTE — Op Note (Signed)
03/29/2014  1:19 PM  PATIENT:  Sara Reyes  48 y.o. 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 Decompressive cervical laminectomy, medial facetectomies C2-3, C3-4, C4-5, C5-6, C6-7 bilaterally, 2. Posterior cervical arthrodesis C2-C7 utilizing locally harvested morselized autologous bone graft and morcellized allograft, 3. Lateral mass fixation C2-C7 inclusive utilizing Nuvasive lateral mass screws, 4. Intraoperative SSEPs and EMGs  SURGEON:  Marikay Alar, MD  ASSISTANTS: Dr. Wynetta Emery  ANESTHESIA:   General  EBL: 200 ml  Total I/O In: 2500 [I.V.:2500] Out: 450 [Urine:250; Blood:200]  BLOOD ADMINISTERED:none  DRAINS: Medium Hemovac   SPECIMEN:  No Specimen  INDICATION FOR PROCEDURE: This patient presented with numbness in her hands and some neck pain. She had been seen by an orthopedic spine surgeon who ordered an MRI of the cervical spine which showed ossification of the posterior longitudinal ligament with severe spinal stenosis. She also had a CT scan which confirmed OPLL. I recommended decompressive posterior cervical laminectomy and instrumented fusion . Patient understood the risks, benefits, and alternatives and potential outcomes and wished to proceed.  PROCEDURE DETAILS: The patient was brought to the operating room. Generalized endotracheal anesthesia was induced and needles were placed for intraoperative SSEPs and EMGs. The patient was affixed a 3 point [place] headrest and rolled into the prone position on chest rolls with her neck slightly flexed. All pressure points were padded. SSEPs and EMGs were monitored during this period and throughout the case. The posterior cervical region was cleaned and prepped with DuraPrep and then draped in the usual sterile fashion. 7 cc of local anesthesia was injected and a dorsal midline incision  made in the posterior cervical region and carried down to the cervical fascia. The fascia was opened and the paraspinous musculature was taken down to expose C2-C7 bilaterally. Intraoperative fluoroscopy confirmed my level and then the dissection was carried out over the lateral facets. I localized the midpoint of each lateral mass and marked a region 1 mm medial to the midpoint of the lateral mass, and then drilled in an upward and outward direction into the safe zone of each lateral mass. I drilled to a depth of 12 mm and then checked my drill hole with a ball probe. I then placed a 12 mm lateral mass screws into the safe zone of each lateral mass until they were 2 fingers tight from C3-C7 on the right and C3-C6 on the left. I could not get good purchase at C7 on the left. I then gently decompressed the central canal with the 1 and 2 mm Kerrison punch from C2 to top third of C7. Medial facetectomies were performed. Once the decompression was complete the dura was full and capacious and I could see the spinal cord pulsatile through the dura. I then turned my attention to the C2 pars screws. I localized the entry zone just superior and medial to the C2-3 facet complex and drilled in an upward and slightly medial trajectory. A palpated with a ball probe in and placed 16 mm pars screws into C2. I then decorticated the lateral masses and the facet joints and packed them with local autograft and morcellized allograft to perform arthrodesis from C2-C7. I then placed rods into the multiaxial screw heads of the screws and locked these into position with the locking caps and anti-torque device. I then checked the final construct with AP/Lat fluoroscopy. I irrigated with saline  solution containing bacitracin. I placed a medium Hemovac drain through separate stab incision, and lined the dura with Gelfoam. After hemostasis was achieved I closed the muscle and the fascia with 0 Vicryl, subcutaneous tissue with 2-0 Vicryl, and  the subcuticular tissue with 3-0 Vicryl. The skin was closed with benzoin and Steri-Strips. At no time did we get changes in our SSEPs. A sterile dressing was applied, the patient was turned to the supine position and taken out of the headrest, awakened from general anesthesia and transferred to the recovery room in stable condition. At the end of the procedure all sponge, needle and instrument counts were correct.   PLAN OF CARE: Admit to inpatient   PATIENT DISPOSITION:  PACU - hemodynamically stable.   Delay start of Pharmacological VTE agent (>24hrs) due to surgical blood loss or risk of bleeding:  yes

## 2014-03-30 MED ORDER — OXYCODONE HCL 5 MG/5ML PO SOLN
10.0000 mg | ORAL | Status: DC | PRN
  Administered 2014-03-30 – 2014-04-01 (×8): 10 mg via ORAL
  Filled 2014-03-30 (×8): qty 10

## 2014-03-30 MED ORDER — OXYCODONE HCL 5 MG PO TABS
10.0000 mg | ORAL_TABLET | ORAL | Status: DC | PRN
  Administered 2014-04-01 – 2014-04-03 (×8): 10 mg via ORAL
  Filled 2014-03-30 (×8): qty 2

## 2014-03-30 NOTE — Evaluation (Signed)
Physical Therapy Evaluation Patient Details Name: Sara Reyes MRN: [id number] DOB: [birth year] Today's Date: 03/30/2014   History of Present Illness  pt is a 48 y.o. female s/p C2-C7 Posterior Cervical Fusion w/lateral mass fixation on 03/29/14.  Clinical Impression  Patient is s/p avove surgery resulting in the deficits listed below (see PT Problem List). Patient will benefit from skilled PT to increase their independence and safety with mobility (while adhering to their precautions) to allow discharge home with family. Patient needs to practice stairs next session prior to D/C.      Follow Up Recommendations No PT follow up;Supervision/Assistance - 24 hour    Equipment Recommendations  None recommended by PT    Recommendations for Other Services OT consult     Precautions / Restrictions Precautions Precautions: Cervical Precaution Booklet Issued: Yes (comment) Precaution Comments: pt given handout and educated on precautions  Restrictions Weight Bearing Restrictions: No      Mobility  Bed Mobility Overal bed mobility: Needs Assistance Bed Mobility: Rolling;Sidelying to Sit Rolling: Supervision Sidelying to sit: Supervision       General bed mobility comments: HOB flatteend to simulate home; cues for log rolling technique; relied on handrail   Transfers Overall transfer level: Needs assistance Equipment used: None Transfers: Sit to/from Stand Sit to Stand: Min guard         General transfer comment: inital stagger to Rt; min guard to steady and cues for safety  Ambulation/Gait Ambulation/Gait assistance: Min guard Ambulation Distance (Feet): 400 Feet Assistive device: None Gait Pattern/deviations: Step-through pattern;Staggering right;Decreased stride length;Narrow base of support Gait velocity: guarded Gait velocity interpretation: Below normal speed for age/gender General Gait Details: pt very guarded due to pain and with shoulders obviously tensed;  cues for relaxation and min guard to steady; pt with staggered steps to Rt primarily initially; reaching for UE PRN  Stairs            Wheelchair Mobility    Modified Rankin (Stroke Patients Only)       Balance Overall balance assessment: Needs assistance Sitting-balance support: Feet supported;No upper extremity supported Sitting balance-Leahy Scale: Fair     Standing balance support: During functional activity;No upper extremity supported Standing balance-Leahy Scale: Fair Standing balance comment: +sway initially; min guard                             Pertinent Vitals/Pain Pain Assessment: No/denies pain    Home Living Family/patient expects to be discharged to:: Private residence Living Arrangements: Other relatives;Parent;Other (Comment) (sister) Available Help at Discharge: Family;Available 24 hours/day Type of Home: House Home Access: Stairs to enter Entrance Stairs-Rails: None Entrance Stairs-Number of Steps: 3 Home Layout: One level Home Equipment: None      Prior Function Level of Independence: Independent               Hand Dominance        Extremity/Trunk Assessment   Upper Extremity Assessment: Defer to OT evaluation           Lower Extremity Assessment: Generalized weakness      Cervical / Trunk Assessment: Normal  Communication   Communication: No difficulties  Cognition Arousal/Alertness: Awake/alert Behavior During Therapy: WFL for tasks assessed/performed Overall Cognitive Status: Within Functional Limits for tasks assessed                      General Comments General comments (skin integrity, edema, etc.): educated on  cervical precautions throughout     Exercises        Assessment/Plan    PT Assessment Patient needs continued PT services  PT Diagnosis Generalized weakness;Acute pain;Abnormality of gait   PT Problem List Decreased strength;Decreased balance;Decreased mobility;Decreased  knowledge of precautions;Pain  PT Treatment Interventions DME instruction;Gait training;Stair training;Functional mobility training;Therapeutic activities;Therapeutic exercise;Balance training;Neuromuscular re-education;Patient/family education   PT Goals (Current goals can be found in the Care Plan section) Acute Rehab PT Goals Patient Stated Goal: to go home saturday PT Goal Formulation: With patient Time For Goal Achievement: 04/03/14 Potential to Achieve Goals: Good    Frequency Min 5X/week   Barriers to discharge        Co-evaluation               End of Session Equipment Utilized During Treatment: Gait belt Activity Tolerance: Patient tolerated treatment well Patient left: in chair;with family/visitor present Nurse Communication: Mobility status;Precautions         Time: 1610-9604 PT Time Calculation (min): 20 min   Charges:   PT Evaluation $Initial PT Evaluation Tier I: 1 Procedure PT Treatments $Gait Training: 8-22 mins   PT G CodesDonell Sievert, [place]  [phone] 03/30/2014, 1:55 PM

## 2014-03-30 NOTE — Evaluation (Signed)
Occupational Therapy Evaluation Patient Details Name: Sara Reyes MRN: [id number] DOB: April 29, 1966 Today's Date: 03/30/2014    History of Present Illness pt is a 48 y.o. female s/p C2-C7 Posterior Cervical Fusion w/lateral mass fixation on 03/29/14.   Clinical Impression   Pt is currently at a supervision level for functional mobility and min assist overall for selfcare.  Educated pt on AE use for LB bathing and dressing which would make pt modified independent level.  Pt does not want AE however and she will have assistance from her family.  Pt is aware of cervical precautions and is able to follow at this time.  Slight right shoulder pain but is not affecting ADL function.  Feel pain is likely not related to surgery based on distrubution.  Educated pt on use of ice to decrease pain at this time and  No further OT needs at this time    Follow Up Recommendations  No OT follow up    Equipment Recommendations  None recommended by OT       Precautions / Restrictions Precautions Precautions: Cervical Precaution Booklet Issued: Yes (comment) Precaution Comments: pt given handout and educated on precautions  Restrictions Weight Bearing Restrictions: No      Mobility Bed Mobility Overal bed mobility: Needs Assistance Bed Mobility: Sidelying to Sit;Sit to Sidelying Rolling: Supervision Sidelying to sit: Supervision     Sit to sidelying: Supervision General bed mobility comments: Min instructional cueing for bed mobility following cervical precautions.   Transfers Overall transfer level: Needs assistance Equipment used: None Transfers: Sit to/from Stand Sit to Stand: Supervision         General transfer comment: pt supervision level for mobility and sit to stand from simulated lower toilet.    Balance Overall balance assessment: Needs assistance Sitting-balance support: Feet supported;No upper extremity supported Sitting balance-Leahy Scale: Good     Standing balance  support: During functional activity;No upper extremity supported Standing balance-Leahy Scale: Fair Standing balance comment: +sway initially; min guard                            ADL Overall ADL's : Needs assistance/impaired Eating/Feeding: Modified independent   Grooming: Modified independent   Upper Body Bathing: Modified independent   Lower Body Bathing: Minimal assistance;Sit to/from stand   Upper Body Dressing : Supervision/safety   Lower Body Dressing: Sit to/from stand;Moderate assistance   Toilet Transfer: Industrial/product designer Details (indicate cue type and reason): simulated Toileting- Clothing Manipulation and Hygiene: Supervision/safety;Sit to/from stand   Tub/ Shower Transfer: Supervision/safety;Ambulation Tub/Shower Transfer Details (indicate cue type and reason): simulated Functional mobility during ADLs: Supervision/safety General ADL Comments: Pt educated on cervical precautions as it applies to bed mobility, bathing, dressing, and grooming tasks.  Pt with increased difficulty reaching either one of her feet for dressing tasks pre-morbidly secondary to hip arthritis.  Provided demonstration of AE for LB selfcare.  Pt reports that she will likely not use secondary to it makes her look old.  She wil have assistance from her mother at discharge.  Instructed her to  keep application of ice on the right anterior shoulder to help with pain but would expect it to improve with time.         Perception Perception Perception Tested?: No   Praxis Praxis Praxis tested?: Within functional limits    Pertinent Vitals/Pain Pain Assessment: Faces Faces Pain Scale: Hurts little more Pain Location: posterior neck  Hand Dominance Right   Extremity/Trunk Assessment Upper Extremity Assessment Upper Extremity Assessment: Generalized weakness;RUE deficits/detail RUE Deficits / Details: Pt with increased pain in the anterior shoulder  which was not present prior to surgery and is not radiating from the neck.  Most likely due to positioning on the table during surgery.  AROM shoulder flexion 0-80 degres with grip strength 4/5   Lower Extremity Assessment Lower Extremity Assessment: Generalized weakness   Cervical / Trunk Assessment Cervical / Trunk Assessment: Normal (Pt with cervical fusion)   Communication Communication Communication: No difficulties   Cognition Arousal/Alertness: Awake/alert Behavior During Therapy: WFL for tasks assessed/performed Overall Cognitive Status: Within Functional Limits for tasks assessed                                Home Living Family/patient expects to be discharged to:: Private residence Living Arrangements: Other relatives;Parent;Other (Comment) (sister) Available Help at Discharge: Family;Available 24 hours/day Type of Home: House Home Access: Stairs to enter [employer] of Steps: 3 Entrance Stairs-Rails: None Home Layout: One level     Bathroom Shower/Tub: Producer, television/film/video: Standard     Home Equipment: None          Prior Functioning/Environment Level of Independence: Independent                      OT Goals(Current goals can be found in the care plan section) Acute Rehab OT Goals Patient Stated Goal: to go home saturday  OT Frequency:                End of Session    Activity Tolerance: Patient tolerated treatment well Patient left: in bed;with call bell/phone within reach   Time: 1451-1545 OT Time Calculation (min): 54 min Charges:  OT General Charges $OT Visit: 1 Procedure OT Evaluation $Initial OT Evaluation Tier I: 1 Procedure OT Treatments $Self Care/Home Management : 38-52 mins  Sara Reyes OTR/L 03/30/2014, 4:17 PM

## 2014-03-30 NOTE — Progress Notes (Signed)
UR complete.  Lemario Chaikin RN, MSN 

## 2014-03-30 NOTE — Progress Notes (Signed)
Patient ID: Sara Reyes, female   DOB: [birth year], 48 y.o.   MRN: [id number] Subjective: Patient reports appropriate neck discomfort. Some sharp R deltoid pain. Denies N/T/W. Has walked to the BR twice and states her legs feel quite stable.  Objective: Vital signs in last 24 hours: Temp:  [98.1 F (36.7 C)-99.8 F (37.7 C)] 99.8 F (37.7 C) (09/17 0550) Pulse Rate:  [70-92] 87 (09/17 0550) Resp:  [7-18] 18 (09/17 0550) BP: (115-146)/(63-89) 115/63 mmHg (09/17 0550) SpO2:  [99 %-100 %] 99 % (09/17 0550)  Intake/Output from previous day: 09/16 0701 - 09/17 0700 In: 4055 [P.O.:300; I.V.:3255; IV Piggyback:500] Out: 2290 [Urine:2000; Drains:90; Blood:200] Intake/Output this shift:    Neurologic: Grossly normal, seems to have normal strength in all extremities to in bed exam  Lab Results: Lab Results  Component Value Date   WBC 6.6 03/22/2014   HGB 11.4* 03/22/2014   HCT 35.1* 03/22/2014   MCV 83.4 03/22/2014   PLT 326 03/22/2014   Lab Results  Component Value Date   INR 1.05 03/22/2014   BMET Lab Results  Component Value Date   NA 139 03/22/2014   K 4.6 03/22/2014   CL 104 03/22/2014   CO2 25 03/22/2014   GLUCOSE 82 03/22/2014   BUN 15 03/22/2014   CREATININE 0.51 03/22/2014   CALCIUM 9.1 03/22/2014    Studies/Results: Dg Cervical Spine 2-3 Views  03/29/2014   CLINICAL DATA:  Fusion C2-C7.  EXAM: DG C-ARM 61-120 MIN; CERVICAL SPINE - 2-3 VIEW  COMPARISON:  CT 12/02/2013 and MRI 03/22/2014  FINDINGS: Examination demonstrates posterior fat spinal fusion hardware which is intact from C2-C7. Region of the C6 and C7 vertebral bodies is not optimally visualized on the lateral film. Note that the fusion high were at the C7 level demonstrates only a right-sided pedicle screw on the frontal film. There is mild spondylosis present.  IMPRESSION: Mild spondylosis of the cervical spine. Fusion hardware as described which appears intact from C2-C7. Recommend correlation with findings at the time of the  procedure.   Electronically Signed   By: Elberta Fortis M.D.   On: 03/29/2014 14:01   Dg C-arm 1-60 Min  03/29/2014   CLINICAL DATA:  Fusion C2-C7.  EXAM: DG C-ARM 61-120 MIN; CERVICAL SPINE - 2-3 VIEW  COMPARISON:  CT 12/02/2013 and MRI 03/22/2014  FINDINGS: Examination demonstrates posterior fat spinal fusion hardware which is intact from C2-C7. Region of the C6 and C7 vertebral bodies is not optimally visualized on the lateral film. Note that the fusion high were at the C7 level demonstrates only a right-sided pedicle screw on the frontal film. There is mild spondylosis present.  IMPRESSION: Mild spondylosis of the cervical spine. Fusion hardware as described which appears intact from C2-C7. Recommend correlation with findings at the time of the procedure.   Electronically Signed   By: Elberta Fortis M.D.   On: 03/29/2014 14:01    Assessment/Plan: Doing fairly well. PT/OT. Pain control.   LOS: 1 day    Avigdor Dollar S 03/30/2014, 8:59 AM

## 2014-03-31 LAB — BASIC METABOLIC PANEL
Anion gap: 10 (ref 5–15)
BUN: 13 mg/dL (ref 6–23)
CALCIUM: 8.5 mg/dL (ref 8.4–10.5)
CO2: 24 meq/L (ref 19–32)
Chloride: 104 mEq/L (ref 96–112)
Creatinine, Ser: 0.46 mg/dL — ABNORMAL LOW (ref 0.50–1.10)
Glucose, Bld: 113 mg/dL — ABNORMAL HIGH (ref 70–99)
Potassium: 4.3 mEq/L (ref 3.7–5.3)
Sodium: 138 mEq/L (ref 137–147)

## 2014-03-31 NOTE — Plan of Care (Signed)
Problem: Consults Goal: Diagnosis - Spinal Surgery Outcome: Completed/Met Date Met:  03/31/14 Cervical Spine Fusion

## 2014-03-31 NOTE — Progress Notes (Signed)
Physical Therapy Treatment Patient Details Name: Sara Reyes MRN: [id number] DOB: 1966/06/07 Today's Date: 03/31/2014    History of Present Illness pt is a 48 y.o. female s/p C2-C7 Posterior Cervical Fusion w/lateral mass fixation on 03/29/14.    PT Comments    Pt much more stable today with gt. All questions answered and pt is safe from mobility standpoint to D/C home with family and encouraged to ambulate unit as tolerated. Pt given heat packs for shoulder pain. Will sign off at this time.   Follow Up Recommendations  No PT follow up;Supervision/Assistance - 24 hour     Equipment Recommendations  None recommended by PT    Recommendations for Other Services       Precautions / Restrictions Precautions Precautions: Cervical Precaution Comments: reviewed handout and precautions; all questions answered Restrictions Weight Bearing Restrictions: No    Mobility  Bed Mobility Overal bed mobility: Needs Assistance Bed Mobility: Rolling;Sidelying to Sit Rolling: Supervision Sidelying to sit: Modified independent (Device/Increase time)       General bed mobility comments: min cues for rolling technique and to setup bed for home enviroment simulation   Transfers Overall transfer level: Modified independent Equipment used: None Transfers: Sit to/from Stand           General transfer comment: good technique wtih standing; incr time duet o pain   Ambulation/Gait Ambulation/Gait assistance: Supervision Ambulation Distance (Feet): 400 Feet Assistive device: None Gait Pattern/deviations: WFL(Within Functional Limits) Gait velocity: guarded Gait velocity interpretation: Below normal speed for age/gender General Gait Details: not LOB noted today; reviewed precautions with gt; encouraged incr ambulation    Stairs Stairs: Yes Stairs assistance: Supervision Stair Management: No rails;Step to pattern;Forwards Number of Stairs: 4 General stair comments: no LOB noted;  supervision initially for safety  Wheelchair Mobility    Modified Rankin (Stroke Patients Only)       Balance Overall balance assessment: No apparent balance deficits (not formally assessed)           Standing balance-Leahy Scale: Good                      Cognition Arousal/Alertness: Awake/alert Behavior During Therapy: WFL for tasks assessed/performed Overall Cognitive Status: Within Functional Limits for tasks assessed                      Exercises      General Comments General comments (skin integrity, edema, etc.): answered all questions regarding D/C disposition and home setup recommendations      Pertinent Vitals/Pain Pain Assessment: 0-10 Pain Score: 6  Pain Location: neck and shoulders Pain Descriptors / Indicators: Aching;Constant    Home Living                      Prior Function            PT Goals (current goals can now be found in the care plan section) Acute Rehab PT Goals Patient Stated Goal: to not have pain in my shoulders PT Goal Formulation: With patient Time For Goal Achievement: 04/03/14 Potential to Achieve Goals: Good Progress towards PT goals: Goals met/education completed, patient discharged from PT    Frequency  Min 5X/week    PT Plan Current plan remains appropriate    Co-evaluation             End of Session   Activity Tolerance: Patient tolerated treatment well Patient left: in bed;with call bell/phone within reach  Time: 3912-2583 PT Time Calculation (min): 29 min  Charges:  $Gait Training: 23-37 mins                    G Codes:      Elie Confer [place], [place] 03/31/2014, 12:53 PM

## 2014-03-31 NOTE — Progress Notes (Signed)
No issues overnight. Pt does cont to have sharp right shoulder pain, somewhat alleviated with ice packs. Strength in BUE is good except right deltoid. She is now tolerating diet well. Voiding normally.  EXAM:  BP 139/72  Pulse 72  Temp(Src) 98.2 F (36.8 C) (Oral)  Resp 18  Ht 5' (1.524 m)  Wt 60.328 kg (133 lb)  BMI 25.97 kg/m2  SpO2 98%  LMP 03/15/2014  Awake, alert, oriented  Speech fluent, appropriate  CN grossly intact  5/5 BUE/BLE x 4/5 right deltoid. No pain with PROM  IMPRESSION:  48 y.o. female POD# 2 s/p posterior cervical decompression/fusion, doing well with right shoulder pain/mild deltoid weakness. Could be positional vs mild C5 neuropraxia  PLAN: - Cont mobilization - Pain control, ice for right shoulder - Likely home tomorrow.

## 2014-04-01 NOTE — Progress Notes (Signed)
No issues overnight. She reports some improvement in left shoulder pain.  EXAM:  BP 139/78  Pulse 75  Temp(Src) 98.2 F (36.8 C) (Oral)  Resp 20  Ht 5' (1.524 m)  Wt 60.328 kg (133 lb)  BMI 25.97 kg/m2  SpO2 99%  LMP 03/15/2014  Awake, alert, oriented  Speech fluent, appropriate  CN grossly intact  5/5 BUE/BLE x 4/5 right delt  IMPRESSION:  48 y.o. female s/p posterior cervical lami/fusion, left shoulder somewhat improving  PLAN: - Home tomorrow - Cont ice packs PRN

## 2014-04-02 NOTE — Progress Notes (Signed)
Orthopedic Tech Progress Note Patient Details:  Sara Reyes 11/10/1965 [id number]  Ortho Devices Type of Ortho Device: Soft collar Ortho Device/Splint Interventions: Application   [name], [name] 04/02/2014, 1:58 PM

## 2014-04-02 NOTE — Progress Notes (Signed)
ANTIBIOTIC CONSULT NOTE   Pharmacy Consult for vancomycin Indication: surgical prophylaxis  Allergies  Allergen Reactions  . Penicillins Rash  . Sulfa Antibiotics Rash    Patient Measurements: Height: 5' (152.4 cm) Weight: 133 lb (60.328 kg) IBW/kg (Calculated) : 45.5  Vital Signs: Temp: 98.2 F (36.8 C) (09/20 1001) Temp src: Oral (09/20 1001) BP: 139/66 mmHg (09/20 1001) Pulse Rate: 60 (09/20 1001) Intake/Output from previous day: 09/19 0701 - 09/20 0700 In: 360 [P.O.:360] Out: 70 [Drains:70] Intake/Output from this shift:    Labs:  Recent Labs  03/31/14 0720  CREATININE 0.46*   Estimated Creatinine Clearance: 68.3 ml/min (by C-G formula based on Cr of 0.46). No results found for this basename: VANCOTROUGH, Leodis Binet, VANCORANDOM, GENTTROUGH, GENTPEAK, GENTRANDOM, TOBRATROUGH, TOBRAPEAK, TOBRARND, AMIKACINPEAK, AMIKACINTROU, AMIKACIN,  in the last 72 hours   Microbiology: Recent Results (from the past 720 hour(s))  SURGICAL PCR SCREEN     Status: Abnormal   Collection Time    03/22/14  8:50 AM      Result Value Ref Range Status   MRSA, PCR NEGATIVE  NEGATIVE Final   Staphylococcus aureus POSITIVE (*) NEGATIVE Final   Comment:            The Xpert SA Assay (FDA     approved for NASAL specimens     in patients over 78 years of age),     is one component of     a comprehensive surveillance     program.  Test performance has     been validated by [employer] for patients greater     than or equal to 5 year old.     It is not intended     to diagnose infection nor to     guide or monitor treatment.    Medical History: Past Medical History  Diagnosis Date  . Shortness of breath     with exertion  . Arthritis   . Medical history non-contributory     Medications:  Prescriptions prior to admission  Medication Sig Dispense Refill  . ibuprofen (ADVIL,MOTRIN) 200 MG tablet Take 800 mg by mouth daily as needed (pain).       Assessment: 48 y/o  female s/p cervical laminectomy with medium hemovac drain placed. Pharmacy consulted to begin vancomycin for post-op prophylaxis. Pt is afeb and renal function stsble.  Goal of Therapy:  Vancomycin trough level 10-15 mcg/ml  Plan:  - Vancomycin 1500 mg IV q24h - Monitor renal function and clinical course   Ethelreda Sukhu, Pharm.D. Clinical Pharmacist Pager: [phone] 04/02/2014 10:14 AM

## 2014-04-02 NOTE — Progress Notes (Signed)
Patient ID: Sara Reyes, female   DOB: [birth year], 48 y.o.   MRN: [id number] Doing well. No weakness. No drainage in hemovac. See orders

## 2014-04-03 ENCOUNTER — Encounter (HOSPITAL_COMMUNITY): Payer: Self-pay | Admitting: Neurological Surgery

## 2014-04-03 MED ORDER — METHOCARBAMOL 500 MG PO TABS: 500.0000 mg | ORAL_TABLET | Freq: Four times a day (QID) | ORAL | Status: DC | PRN

## 2014-04-03 MED ORDER — OXYCODONE HCL 10 MG PO TABS: 10.0000 mg | ORAL_TABLET | ORAL | Status: DC | PRN

## 2014-04-03 NOTE — Progress Notes (Signed)
Pt discharged at this time alert, verbal with her sister. No noted distress. IV discontinued dry dressing. Discharge instructions given with prescription with verbal understanding. Pt to follow up with Neurology per discharge instructions.

## 2014-04-03 NOTE — Discharge Summary (Signed)
Physician Discharge Summary  Patient ID: Sara Reyes MRN: [id number] DOB/AGE: 1966/01/21 48 y.o.  Admit date: 03/29/2014 Discharge date: 04/03/2014  Admission Diagnoses: cervical stenosis with OPLL    Discharge Diagnoses: same   Discharged Condition: good  Hospital Course: The patient was admitted on 03/29/2014 and taken to the operating room where the patient underwent cervical lam C2-C7 with PCF. The patient tolerated the procedure well and was taken to the recovery room and then to the floor in stable condition. The hospital course was routine. There were no complications. The wound remained clean dry and intact. Pt had appropriate neck soreness.Had improving R deltoid pain but no new N/T/W. remained afebrile with stable vital signs, and tolerated a regular diet. The patient continued to increase activities, and pain was well controlled with oral pain medications.   Consults: none  Significant Diagnostic Studies:  Results for orders placed during the hospital encounter of 03/29/14  BASIC METABOLIC PANEL      Result Value Ref Range   Sodium 138  137 - 147 mEq/L   Potassium 4.3  3.7 - 5.3 mEq/L   Chloride 104  96 - 112 mEq/L   CO2 24  19 - 32 mEq/L   Glucose, Bld 113 (*) 70 - 99 mg/dL   BUN 13  6 - 23 mg/dL   Creatinine, Ser 1.30 (*) 0.50 - 1.10 mg/dL   Calcium 8.5  8.4 - 86.5 mg/dL   GFR calc non Af Amer >90  >90 mL/min   GFR calc Af Amer >90  >90 mL/min   Anion gap 10  5 - 15    Chest 2 View  03/22/2014   CLINICAL DATA:  Preop cervical spine fusion.  Shortness of breath.  EXAM: CHEST  2 VIEW  COMPARISON:  06/04/2006  FINDINGS: The cardiomediastinal silhouette is within normal limits. The lungs are better inflated than on the prior radiographs. No airspace consolidation, overt pulmonary edema, pleural effusion, or pneumothorax is identified. Multiple old right rib fractures are noted.  IMPRESSION: No active cardiopulmonary disease.   Electronically Signed   By: Sebastian Ache    On: 03/22/2014 09:48   Dg Cervical Spine 2-3 Views  03/29/2014   CLINICAL DATA:  Fusion C2-C7.  EXAM: DG C-ARM 61-120 MIN; CERVICAL SPINE - 2-3 VIEW  COMPARISON:  CT 12/02/2013 and MRI 03/22/2014  FINDINGS: Examination demonstrates posterior fat spinal fusion hardware which is intact from C2-C7. Region of the C6 and C7 vertebral bodies is not optimally visualized on the lateral film. Note that the fusion high were at the C7 level demonstrates only a right-sided pedicle screw on the frontal film. There is mild spondylosis present.  IMPRESSION: Mild spondylosis of the cervical spine. Fusion hardware as described which appears intact from C2-C7. Recommend correlation with findings at the time of the procedure.   Electronically Signed   By: Elberta Fortis M.D.   On: 03/29/2014 14:01   Dg C-arm 1-60 Min  03/29/2014   CLINICAL DATA:  Fusion C2-C7.  EXAM: DG C-ARM 61-120 MIN; CERVICAL SPINE - 2-3 VIEW  COMPARISON:  CT 12/02/2013 and MRI 03/22/2014  FINDINGS: Examination demonstrates posterior fat spinal fusion hardware which is intact from C2-C7. Region of the C6 and C7 vertebral bodies is not optimally visualized on the lateral film. Note that the fusion high were at the C7 level demonstrates only a right-sided pedicle screw on the frontal film. There is mild spondylosis present.  IMPRESSION: Mild spondylosis of the cervical spine. Fusion hardware as  described which appears intact from C2-C7. Recommend correlation with findings at the time of the procedure.   Electronically Signed   By: Elberta Fortis M.D.   On: 03/29/2014 14:01    Antibiotics:  Anti-infectives   Start     Dose/Rate Route Frequency Ordered Stop   03/30/14 0500  vancomycin (VANCOCIN) 1,500 mg in sodium chloride 0.9 % 500 mL IVPB  Status:  Discontinued     1,500 mg 250 mL/hr over 120 Minutes Intravenous Every 24 hours 03/29/14 1711 04/02/14 1203   03/29/14 1102  bacitracin 50,000 Units in sodium chloride irrigation 0.9 % 500 mL irrigation   Status:  Discontinued       As needed 03/29/14 1103 03/29/14 1344   03/29/14 0927  vancomycin (VANCOCIN) 1 GM/200ML IVPB    Comments:  Romie Minus   : cabinet override      03/29/14 0927 03/29/14 1033   03/29/14 0740  ceFAZolin (ANCEF) 2-3 GM-% IVPB SOLR    Comments:  Lonia Chimera   : cabinet override      03/29/14 0740 03/29/14 1944   03/29/14 0737  ceFAZolin (ANCEF) IVPB 2 g/50 mL premix  Status:  Discontinued     2 g 100 mL/hr over 30 Minutes Intravenous On call to O.R. 03/29/14 0737 03/29/14 1647      Discharge Exam: Blood pressure 138/81, pulse 70, temperature 98.2 F (36.8 C), temperature source Oral, resp. rate 20, height 5' (1.524 m), weight 60.328 kg (133 lb), last menstrual period 03/15/2014, SpO2 98.00%. Neurologic: Grossly normal Incision CDI Discharge Medications:     Medication List    STOP taking these medications       ibuprofen 200 MG tablet  Commonly known as:  ADVIL,MOTRIN      TAKE these medications       methocarbamol 500 MG tablet  Commonly known as:  ROBAXIN  Take 1 tablet (500 mg total) by mouth every 6 (six) hours as needed for muscle spasms.     Oxycodone HCl 10 MG Tabs  Take 1 tablet (10 mg total) by mouth every 4 (four) hours as needed for moderate pain.        Disposition: home  Final Dx: CL/PCF C2-C7     Discharge Instructions   Call MD for:  difficulty breathing, headache or visual disturbances    Complete by:  As directed      Call MD for:  persistant nausea and vomiting    Complete by:  As directed      Call MD for:  redness, tenderness, or signs of infection (pain, swelling, redness, odor or green/yellow discharge around incision site)    Complete by:  As directed      Call MD for:  severe uncontrolled pain    Complete by:  As directed      Call MD for:  temperature >100.4    Complete by:  As directed      Diet - low sodium heart healthy    Complete by:  As directed      Discharge instructions    Complete by:  As  directed   No strenuous, no driving, no heavylifting     Increase activity slowly    Complete by:  As directed            Follow-up Information   Follow up with Alaiah Lundy S, MD. Schedule an appointment as soon as possible for a visit in 3 weeks.   Specialty:  Neurosurgery   Contact information:  [address] [place] [address] [phone]        Signed: Tia Alert 04/03/2014, 12:57 PM

## 2014-04-13 ENCOUNTER — Encounter (HOSPITAL_COMMUNITY): Payer: Self-pay | Admitting: Emergency Medicine

## 2014-04-13 ENCOUNTER — Emergency Department (HOSPITAL_COMMUNITY)
Admission: EM | Admit: 2014-04-13 | Discharge: 2014-04-13 | Disposition: A | Discharge status: Home / Self Care | Payer: BC Managed Care – PPO | Attending: Emergency Medicine | Admitting: Emergency Medicine

## 2014-04-13 DIAGNOSIS — Z8739 Personal history of other diseases of the musculoskeletal system and connective tissue: Secondary | ICD-10-CM | POA: Insufficient documentation

## 2014-04-13 DIAGNOSIS — I82402 Acute embolism and thrombosis of unspecified deep veins of left lower extremity: Secondary | ICD-10-CM | POA: Insufficient documentation

## 2014-04-13 DIAGNOSIS — M542 Cervicalgia: Secondary | ICD-10-CM | POA: Insufficient documentation

## 2014-04-13 DIAGNOSIS — Z981 Arthrodesis status: Secondary | ICD-10-CM | POA: Diagnosis not present

## 2014-04-13 DIAGNOSIS — L819 Disorder of pigmentation, unspecified: Secondary | ICD-10-CM | POA: Diagnosis not present

## 2014-04-13 DIAGNOSIS — Z880 Allergy status to penicillin: Secondary | ICD-10-CM | POA: Insufficient documentation

## 2014-04-13 DIAGNOSIS — R21 Rash and other nonspecific skin eruption: Secondary | ICD-10-CM | POA: Insufficient documentation

## 2014-04-13 DIAGNOSIS — M79609 Pain in unspecified limb: Secondary | ICD-10-CM

## 2014-04-13 DIAGNOSIS — M79605 Pain in left leg: Secondary | ICD-10-CM | POA: Diagnosis present

## 2014-04-13 LAB — I-STAT CHEM 8, ED
BUN: 10 mg/dL (ref 6–23)
CHLORIDE: 108 meq/L (ref 96–112)
Calcium, Ion: 1.21 mmol/L (ref 1.12–1.23)
Creatinine, Ser: 0.5 mg/dL (ref 0.50–1.10)
Glucose, Bld: 104 mg/dL — ABNORMAL HIGH (ref 70–99)
HEMATOCRIT: 37 % (ref 36.0–46.0)
Hemoglobin: 12.6 g/dL (ref 12.0–15.0)
Potassium: 3.7 mEq/L (ref 3.7–5.3)
SODIUM: 140 meq/L (ref 137–147)
TCO2: 29 mmol/L (ref 0–100)

## 2014-04-13 LAB — CBC WITH DIFFERENTIAL/PLATELET
BASOS ABS: 0 10*3/uL (ref 0.0–0.1)
BASOS PCT: 1 % (ref 0–1)
Eosinophils Absolute: 0.1 10*3/uL (ref 0.0–0.7)
Eosinophils Relative: 2 % (ref 0–5)
HCT: 34.1 % — ABNORMAL LOW (ref 36.0–46.0)
Hemoglobin: 11.2 g/dL — ABNORMAL LOW (ref 12.0–15.0)
Lymphocytes Relative: 18 % (ref 12–46)
Lymphs Abs: 1.2 10*3/uL (ref 0.7–4.0)
MCH: 27.1 pg (ref 26.0–34.0)
MCHC: 32.8 g/dL (ref 30.0–36.0)
MCV: 82.6 fL (ref 78.0–100.0)
Monocytes Absolute: 0.6 10*3/uL (ref 0.1–1.0)
Monocytes Relative: 10 % (ref 3–12)
NEUTROS ABS: 4.5 10*3/uL (ref 1.7–7.7)
NEUTROS PCT: 69 % (ref 43–77)
PLATELETS: 317 10*3/uL (ref 150–400)
RBC: 4.13 MIL/uL (ref 3.87–5.11)
RDW: 12.8 % (ref 11.5–15.5)
WBC: 6.4 10*3/uL (ref 4.0–10.5)

## 2014-04-13 MED ORDER — RIVAROXABAN 15 MG PO TABS
15.0000 mg | ORAL_TABLET | Freq: Two times a day (BID) | ORAL | Status: DC
  Administered 2014-04-13: 15 mg via ORAL
  Filled 2014-04-13 (×2): qty 1

## 2014-04-13 MED ORDER — XARELTO VTE STARTER PACK 15 & 20 MG PO TBPK: 15.0000 mg | ORAL_TABLET | ORAL | Status: DC

## 2014-04-13 NOTE — ED Provider Notes (Addendum)
CSN: [id number]     Arrival date & time 04/13/14  1312 History   First MD Initiated Contact with Patient 04/13/14 1330     Chief Complaint  Patient presents with  . Leg Pain     (Consider location/radiation/quality/duration/timing/severity/associated sxs/prior Treatment) Patient is a 48 y.o. female presenting with leg pain.  Leg Pain Associated symptoms: neck pain    Complains of left leg pain and swelling first noted today. Pain is mild, at the calf and slightly at the distal thigh left-sided. Discomfort is mild, not made better or worse with anything. She is noted "roughened skin" at groins bilaterally since discharge from hospital for cervical fusion. First noted skin changes at groins. States skin is improving with time. She has treated groin skin changes with  Eucerin cream. No other associated symptoms no weakness no trauma no fever Past Medical History  Diagnosis Date  . Shortness of breath     with exertion  . Arthritis   . Medical history non-contributory    Past Surgical History  Procedure Laterality Date  . Hand surgery Right     nerve repair  . Eye surgery Bilateral     lasik  . Posterior cervical fusion/foraminotomy N/A 03/29/2014    Procedure: CERVICAL TWO THRU CERVICAL SEVEN Posterior Cervical Fusion w/lateral mass fixation, CERVICAL THREE THRU CERVICAL SEVEN Cervical Laminectomy;  Surgeon: Tia Alertavid S Jones, MD;  Location: [facility];  Service: Neurosurgery;  Laterality: N/A;   History reviewed. No pertinent family history. History  Substance Use Topics  . Smoking status: Never Smoker   . Smokeless tobacco: Not on file  . Alcohol Use: Yes     Comment: socially   OB History   Grav Para Term Preterm Abortions TAB SAB Ect Mult Living                 Review of Systems  Constitutional: Negative.   HENT: Negative.   Respiratory: Negative.   Cardiovascular: Negative.   Gastrointestinal: Negative.   Genitourinary:       Currently on menses  Musculoskeletal:  Positive for myalgias and neck pain.       Pain and swelling at left calf at distal left thigh mild neck pain and limited range of motion since cervical fusion  Skin: Positive for rash.  Neurological: Negative.   Psychiatric/Behavioral: Negative.   All other systems reviewed and are negative.     Allergies  Penicillins and Sulfa antibiotics  Home Medications   Prior to Admission medications   Medication Sig Start Date End Date Taking? Authorizing Provider  methocarbamol (ROBAXIN) 500 MG tablet Take 1 tablet (500 mg total) by mouth every 6 (six) hours as needed for muscle spasms. 04/03/14   Tia Alertavid S Jones, MD  oxyCODONE 10 MG TABS Take 1 tablet (10 mg total) by mouth every 4 (four) hours as needed for moderate pain. 04/03/14   Tia Alertavid S Jones, MD   BP 137/98  Pulse 107  Temp(Src) 98 F (36.7 C) (Oral)  Resp 16  SpO2 98%  LMP 03/15/2014 Physical Exam  Nursing note and vitals reviewed. Constitutional: She appears well-developed and well-nourished.  HENT:  Head: Normocephalic and atraumatic.  Eyes: Conjunctivae are normal. Pupils are equal, round, and reactive to light.  Neck: Neck supple. No tracheal deviation present. No thyromegaly present.  Cardiovascular: Normal rate and regular rhythm.   No murmur heard. Pulmonary/Chest: Effort normal and breath sounds normal.  Abdominal: Soft. Bowel sounds are normal. She exhibits no distension. There is no  tenderness.  Musculoskeletal: Normal range of motion. She exhibits no edema and no tenderness.  Left lower extremity no swelling mildly tender at the calf and distal medial thigh. No cords. No Homans sign. DP pulse 2+. Right lower extremity swelling or tenderness tenderness DP pulse 2+  Neurological: She is alert. Coordination normal.  Skin: Skin is warm and dry. No rash noted.  Petechial rash at left calf. Bilateral groin areas hyperpigmented with grayish border suggestive of fungal infection. There is no temperature differential between  Lower extremities  Psychiatric: She has a normal mood and affect.    ED Course  Procedures (including critical care time) Labs Review Labs Reviewed - No data to display  Imaging Review No results found.   EKG Interpretation None     Results for orders placed during the hospital encounter of 04/13/14  CBC WITH DIFFERENTIAL      Result Value Ref Range   WBC 6.4  4.0 - 10.5 K/uL   RBC 4.13  3.87 - 5.11 MIL/uL   Hemoglobin 11.2 (*) 12.0 - 15.0 g/dL   HCT 16.1 (*) 09.6 - 04.5 %   MCV 82.6  78.0 - 100.0 fL   MCH 27.1  26.0 - 34.0 pg   MCHC 32.8  30.0 - 36.0 g/dL   RDW 40.9  81.1 - 91.4 %   Platelets 317  150 - 400 K/uL   Neutrophils Relative % 69  43 - 77 %   Neutro Abs 4.5  1.7 - 7.7 K/uL   Lymphocytes Relative 18  12 - 46 %   Lymphs Abs 1.2  0.7 - 4.0 K/uL   Monocytes Relative 10  3 - 12 %   Monocytes Absolute 0.6  0.1 - 1.0 K/uL   Eosinophils Relative 2  0 - 5 %   Eosinophils Absolute 0.1  0.0 - 0.7 K/uL   Basophils Relative 1  0 - 1 %   Basophils Absolute 0.0  0.0 - 0.1 K/uL  I-STAT CHEM 8, ED      Result Value Ref Range   Sodium 140  137 - 147 mEq/L   Potassium 3.7  3.7 - 5.3 mEq/L   Chloride 108  96 - 112 mEq/L   BUN 10  6 - 23 mg/dL   Creatinine, Ser 7.82  0.50 - 1.10 mg/dL   Glucose, Bld 956 (*) 70 - 99 mg/dL   Calcium, Ion 2.13  0.86 - 1.23 mmol/L   TCO2 29  0 - 100 mmol/L   Hemoglobin 12.6  12.0 - 15.0 g/dL   HCT 57.8  46.9 - 62.9 %   Chest 2 View  03/22/2014   CLINICAL DATA:  Preop cervical spine fusion.  Shortness of breath.  EXAM: CHEST  2 VIEW  COMPARISON:  06/04/2006  FINDINGS: The cardiomediastinal silhouette is within normal limits. The lungs are better inflated than on the prior radiographs. No airspace consolidation, overt pulmonary edema, pleural effusion, or pneumothorax is identified. Multiple old right rib fractures are noted.  IMPRESSION: No active cardiopulmonary disease.   Electronically Signed   By: Sebastian Ache   On: 03/22/2014 09:48   Dg  Cervical Spine 2-3 Views  03/29/2014   CLINICAL DATA:  Fusion C2-C7.  EXAM: DG C-ARM 61-120 MIN; CERVICAL SPINE - 2-3 VIEW  COMPARISON:  CT 12/02/2013 and MRI 03/22/2014  FINDINGS: Examination demonstrates posterior fat spinal fusion hardware which is intact from C2-C7. Region of the C6 and C7 vertebral bodies is not optimally visualized on the lateral  film. Note that the fusion high were at the C7 level demonstrates only a right-sided pedicle screw on the frontal film. There is mild spondylosis present.  IMPRESSION: Mild spondylosis of the cervical spine. Fusion hardware as described which appears intact from C2-C7. Recommend correlation with findings at the time of the procedure.   Electronically Signed   By: Elberta Fortis M.D.   On: 03/29/2014 14:01   Dg C-arm 1-60 Min  03/29/2014   CLINICAL DATA:  Fusion C2-C7.  EXAM: DG C-ARM 61-120 MIN; CERVICAL SPINE - 2-3 VIEW  COMPARISON:  CT 12/02/2013 and MRI 03/22/2014  FINDINGS: Examination demonstrates posterior fat spinal fusion hardware which is intact from C2-C7. Region of the C6 and C7 vertebral bodies is not optimally visualized on the lateral film. Note that the fusion high were at the C7 level demonstrates only a right-sided pedicle screw on the frontal film. There is mild spondylosis present.  IMPRESSION: Mild spondylosis of the cervical spine. Fusion hardware as described which appears intact from C2-C7. Recommend correlation with findings at the time of the procedure.   Electronically Signed   By: Elberta Fortis M.D.   On: 03/29/2014 14:01    MDM  Venous Doppler study ordered due to concern for thrombophlebitis Final diagnoses:  None   Doppler Study left low consistent with DVT Spoke with Dr. Yetta Barre, neurosurgery is okay to start Xarelto in light of recent surgery. He will follow patient at postop office visit 04/17/2014 Also spoke with Dr. Cliffton Asters plan appointment scheduled for office April 27, 2014 12 noon Xarelto starter kt prescribed, 1st dose  given in the ED Diagnosis: Deep vein thrombosis left lower extremity    Doug Sou, MD 04/13/14 1539  Doug Sou, MD 04/13/14 1610

## 2014-04-13 NOTE — Progress Notes (Signed)
*  PRELIMINARY RESULTS* Vascular Ultrasound Left lower extremity venous duplex has been completed.  Preliminary findings: Evidence of DVT involving the left peroneal and posterior tibial veins.  Farrel DemarkJill Eunice, RDMS, RVT  04/13/2014, 2:54 PM

## 2014-04-13 NOTE — ED Notes (Addendum)
Pt reports having neck fusion surgery in mid-September. Reports that recover was uneventful except that she began to get red spots and muscle spasms on L lower leg and has dark rash in inguinal area. No swelling noted.

## 2014-04-13 NOTE — ED Notes (Signed)
Given AVS and explained in thorough detail. Given Xarelto starter pack and explained in detail. Knows when to come back (unusual bleeding/bruising, etc). No other questions/concerns. Knows dose given here is the only Xarelto pill she will take today.

## 2014-04-13 NOTE — Discharge Instructions (Signed)
Deep Vein Thrombosis An appointment has been scheduled for you with Dr. Cliffton Asters for 04/27/2014 at 12 noon p.m. at her office. Keep your scheduled appointment with Dr. Yetta Barre next week. A deep vein thrombosis (DVT) is a blood clot that develops in the deep, larger veins of the leg, arm, or pelvis. These are more dangerous than clots that might form in veins near the surface of the body. A DVT can lead to serious and even life-threatening complications if the clot breaks off and travels in the bloodstream to the lungs.  A DVT can damage the valves in your leg veins so that instead of flowing upward, the blood pools in the lower leg. This is called post-thrombotic syndrome, and it can result in pain, swelling, discoloration, and sores on the leg. CAUSES Usually, several things contribute to the formation of blood clots. Contributing factors include:  The flow of blood slows down.  The inside of the vein is damaged in some way.  You have a condition that makes blood clot more easily. RISK FACTORS Some people are more likely than others to develop blood clots. Risk factors include:   Smoking.  Being overweight (obese).  Sitting or lying still for a long time. This includes long-distance travel, paralysis, or recovery from an illness or surgery. Other factors that increase risk are:   Older age, especially over 65 years of age.  Having a family history of blood clots or if you have already had a blot clot.  Having major or lengthy surgery. This is especially true for surgery on the hip, knee, or belly (abdomen). Hip surgery is particularly high risk.  Having a long, thin tube (catheter) placed inside a vein during a medical procedure.  Breaking a hip or leg.  Having cancer or cancer treatment.  Pregnancy and childbirth.  Hormone changes make the blood clot more easily during pregnancy.  The fetus puts pressure on the veins of the pelvis.  There is a risk of injury to veins during  delivery or a caesarean delivery. The risk is highest just after childbirth.  Medicines containing the female hormone estrogen. This includes birth control pills and hormone replacement therapy.  Other circulation or heart problems.  SIGNS AND SYMPTOMS When a clot forms, it can either partially or totally block the blood flow in that vein. Symptoms of a DVT can include:  Swelling of the leg or arm, especially if one side is much worse.  Warmth and redness of the leg or arm, especially if one side is much worse.  Pain in an arm or leg. If the clot is in the leg, symptoms may be more noticeable or worse when standing or walking. The symptoms of a DVT that has traveled to the lungs (pulmonary embolism, PE) usually start suddenly and include:  Shortness of breath.  Coughing.  Coughing up blood or blood-tinged mucus.  Chest pain. The chest pain is often worse with deep breaths.  Rapid heartbeat. Anyone with these symptoms should get emergency medical treatment right away. Do not wait to see if the symptoms will go away. Call your local emergency services (911 in the [place] if you have these symptoms. Do not drive yourself to the hospital. DIAGNOSIS If a DVT is suspected, your health care provider will take a full medical history and perform a physical exam. Tests that also may be required include:  Blood tests, including studies of the clotting properties of the blood.  Ultrasound to see if you have clots in your  legs or lungs.  X-rays to show the flow of blood when dye is injected into the veins (venogram).  Studies of your lungs if you have any chest symptoms. PREVENTION  Exercise the legs regularly. Take a brisk 30-minute walk every day.  Maintain a weight that is appropriate for your height.  Avoid sitting or lying in bed for long periods of time without moving your legs.  Women, particularly those over the age of 35 years, should consider the risks and benefits of taking  estrogen medicines, including birth control pills.  Do not smoke, especially if you take estrogen medicines.  Long-distance travel can increase your risk of DVT. You should exercise your legs by walking or pumping the muscles every hour.  Many of the risk factors above relate to situations that exist with hospitalization, either for illness, injury, or elective surgery. Prevention may include medical and nonmedical measures.  Your health care provider will assess you for the need for venous thromboembolism prevention when you are admitted to the hospital. If you are having surgery, your surgeon will assess you the day of or day after surgery. TREATMENT Once identified, a DVT can be treated. It can also be prevented in some circumstances. Once you have had a DVT, you may be at increased risk for a DVT in the future. The most common treatment for DVT is blood-thinning (anticoagulant) medicine, which reduces the blood's tendency to clot. Anticoagulants can stop new blood clots from forming and stop old clots from growing. They cannot dissolve existing clots. Your body does this by itself over time. Anticoagulants can be given by mouth, through an IV tube, or by injection. Your health care provider will determine the best program for you. Other medicines or treatments that may be used are:  Heparin or related medicines (low molecular weight heparin) are often the first treatment for a blood clot. They act quickly. However, they cannot be taken orally and must be given either in shot form or by IV tube.  Heparin can cause a fall in a component of blood that stops bleeding and forms blood clots (platelets). You will be monitored with blood tests to be sure this does not occur.  Warfarin is an anticoagulant that can be swallowed. It takes a few days to start working, so usually heparin or related medicines are used in combination. Once warfarin is working, heparin is usually stopped.  Factor Xa inhibitor  medicines, such as rivaroxaban and apixaban, also reduce blood clotting. These medicines are taken orally and can often be used without heparin or related medicines.  Less commonly, clot dissolving drugs (thrombolytics) are used to dissolve a DVT. They carry a high risk of bleeding, so they are used mainly in severe cases where your life or a part of your body is threatened.  Very rarely, a blood clot in the leg needs to be removed surgically.  If you are unable to take anticoagulants, your health care provider may arrange for you to have a filter placed in a main vein in your abdomen. This filter prevents clots from traveling to your lungs. HOME CARE INSTRUCTIONS  Take all medicines as directed by your health care provider.  Learn as much as you can about DVT.  Wear a medical alert bracelet or carry a medical alert card.  Ask your health care provider how soon you can go back to normal activities. It is important to stay active to prevent blood clots. If you are on anticoagulant medicine, avoid contact sports.  It is very important to exercise. This is especially important while traveling, sitting, or standing for long periods of time. Exercise your legs by walking or by tightening and relaxing your leg muscles regularly. Take frequent walks.  You may need to wear compression stockings. These are tight elastic stockings that apply pressure to the lower legs. This pressure can help keep the blood in the legs from clotting. Taking Warfarin Warfarin is a daily medicine that is taken by mouth. Your health care provider will advise you on the length of treatment (usually 3-6 months, sometimes lifelong). If you take warfarin:  Understand how to take warfarin and foods that can affect how warfarin works in Public relations account executiveyour body.  Too much and too little warfarin are both dangerous. Too much warfarin increases the risk of bleeding. Too little warfarin continues to allow the risk for blood clots. Warfarin and  Regular Blood Testing While taking warfarin, you will need to have regular blood tests to measure your blood clotting time. These blood tests usually include both the prothrombin time (PT) and international normalized ratio (INR) tests. The PT and INR results allow your health care provider to adjust your dose of warfarin. It is very important that you have your PT and INR tested as often as directed by your health care provider.  Warfarin and Your Diet Avoid major changes in your diet, or notify your health care provider before changing your diet. Arrange a visit with a registered dietitian to answer your questions. Many foods, especially foods high in vitamin K, can interfere with warfarin and affect the PT and INR results. You should eat a consistent amount of foods high in vitamin K. Foods high in vitamin K include:   Spinach, kale, broccoli, cabbage, collard and turnip greens, Brussels sprouts, peas, cauliflower, seaweed, and parsley.  Beef and pork liver.  Green tea.  Soybean oil. Warfarin with Other Medicines Many medicines can interfere with warfarin and affect the PT and INR results. You must:  Tell your health care provider about any and all medicines, vitamins, and supplements you take, including aspirin and other over-the-counter anti-inflammatory medicines. Be especially cautious with aspirin and anti-inflammatory medicines. Ask your health care provider before taking these.  Do not take or discontinue any prescribed or over-the-counter medicine except on the advice of your health care provider or pharmacist. Warfarin Side Effects Warfarin can have side effects, such as easy bruising and difficulty stopping bleeding. Ask your health care provider or pharmacist about other side effects of warfarin. You will need to:  Hold pressure over cuts for longer than usual.  Notify your dentist and other health care providers that you are taking warfarin before you undergo any procedures  where bleeding may occur. Warfarin with Alcohol and Tobacco   Drinking alcohol frequently can increase the effect of warfarin, leading to excess bleeding. It is best to avoid alcoholic drinks or to consume only very small amounts while taking warfarin. Notify your health care provider if you change your alcohol intake.   Do not use any tobacco products including cigarettes, chewing tobacco, or electronic cigarettes. If you smoke, quit. Ask your health care provider for help with quitting smoking. Alternative Medicines to Warfarin: Factor Xa Inhibitor Medicines  These blood-thinning medicines are taken by mouth, usually for several weeks or longer. It is important to take the medicine every single day at the same time each day.  There are no regular blood tests required when using these medicines.  There are fewer food and  drug interactions than with warfarin.  The side effects of this class of medicine are similar to those of warfarin, including excessive bruising or bleeding. Ask your health care provider or pharmacist about other potential side effects. SEEK MEDICAL CARE IF:  You notice a rapid heartbeat.  You feel weaker or more tired than usual.  You feel faint.  You notice increased bruising.  You feel your symptoms are not getting better in the time expected.  You believe you are having side effects of medicine. SEEK IMMEDIATE MEDICAL CARE IF:  You have chest pain.  You have trouble breathing.  You have new or increased swelling or pain in one leg.  You cough up blood.  You notice blood in vomit, in a bowel movement, or in urine. MAKE SURE YOU:  Understand these instructions.  Will watch your condition.  Will get help right away if you are not doing well or get worse. Document Released: 06/30/2005 Document Revised: 11/14/2013 Document Reviewed: 03/07/2013 [facility] Patient Information 2015 [place], [place]. This information is not intended to replace advice given  to you by your health care provider. Make sure you discuss any questions you have with your health care provider.

## 2014-05-15 ENCOUNTER — Other Ambulatory Visit: Payer: Self-pay | Admitting: Neurological Surgery

## 2014-05-19 ENCOUNTER — Other Ambulatory Visit: Payer: Self-pay | Admitting: Neurological Surgery

## 2014-05-19 DIAGNOSIS — Z86718 Personal history of other venous thrombosis and embolism: Secondary | ICD-10-CM

## 2014-07-03 ENCOUNTER — Ambulatory Visit
Admission: RE | Admit: 2014-07-03 | Discharge: 2014-07-03 | Disposition: A | Discharge status: Home / Self Care | Payer: BC Managed Care – PPO | Source: Ambulatory Visit | Attending: Neurological Surgery | Admitting: Neurological Surgery

## 2014-07-03 ENCOUNTER — Encounter (INDEPENDENT_AMBULATORY_CARE_PROVIDER_SITE_OTHER): Payer: Self-pay

## 2014-07-03 DIAGNOSIS — Z86718 Personal history of other venous thrombosis and embolism: Secondary | ICD-10-CM

## 2014-07-31 ENCOUNTER — Other Ambulatory Visit: Payer: Self-pay

## 2014-07-31 DIAGNOSIS — Z1231 Encounter for screening mammogram for malignant neoplasm of breast: Secondary | ICD-10-CM

## 2014-08-11 ENCOUNTER — Encounter (INDEPENDENT_AMBULATORY_CARE_PROVIDER_SITE_OTHER): Payer: Self-pay

## 2014-08-11 ENCOUNTER — Ambulatory Visit
Admission: RE | Admit: 2014-08-11 | Discharge: 2014-08-11 | Disposition: A | Discharge status: Home / Self Care | Payer: BLUE CROSS/BLUE SHIELD | Source: Ambulatory Visit

## 2014-08-11 DIAGNOSIS — Z1231 Encounter for screening mammogram for malignant neoplasm of breast: Secondary | ICD-10-CM

## 2014-08-17 ENCOUNTER — Other Ambulatory Visit: Payer: Self-pay | Admitting: Family Medicine

## 2014-08-17 ENCOUNTER — Other Ambulatory Visit (HOSPITAL_COMMUNITY)
Admission: RE | Admit: 2014-08-17 | Discharge: 2014-08-17 | Disposition: A | Discharge status: Home / Self Care | Payer: BLUE CROSS/BLUE SHIELD | Source: Ambulatory Visit | Attending: Family Medicine | Admitting: Family Medicine

## 2014-08-17 DIAGNOSIS — Z124 Encounter for screening for malignant neoplasm of cervix: Secondary | ICD-10-CM | POA: Insufficient documentation

## 2014-08-17 DIAGNOSIS — Z1151 Encounter for screening for human papillomavirus (HPV): Secondary | ICD-10-CM | POA: Insufficient documentation

## 2014-08-22 LAB — CYTOLOGY - PAP

## 2014-10-13 ENCOUNTER — Emergency Department (HOSPITAL_COMMUNITY)
Admission: EM | Admit: 2014-10-13 | Discharge: 2014-10-13 | Disposition: A | Discharge status: Home / Self Care | Payer: BLUE CROSS/BLUE SHIELD | Attending: Emergency Medicine | Admitting: Emergency Medicine

## 2014-10-13 ENCOUNTER — Emergency Department (HOSPITAL_COMMUNITY): Payer: BLUE CROSS/BLUE SHIELD

## 2014-10-13 ENCOUNTER — Encounter (HOSPITAL_COMMUNITY): Payer: Self-pay | Admitting: Emergency Medicine

## 2014-10-13 DIAGNOSIS — Y9389 Activity, other specified: Secondary | ICD-10-CM | POA: Insufficient documentation

## 2014-10-13 DIAGNOSIS — S39012A Strain of muscle, fascia and tendon of lower back, initial encounter: Secondary | ICD-10-CM | POA: Diagnosis not present

## 2014-10-13 DIAGNOSIS — Z79899 Other long term (current) drug therapy: Secondary | ICD-10-CM | POA: Insufficient documentation

## 2014-10-13 DIAGNOSIS — Y92410 Unspecified street and highway as the place of occurrence of the external cause: Secondary | ICD-10-CM | POA: Insufficient documentation

## 2014-10-13 DIAGNOSIS — M1990 Unspecified osteoarthritis, unspecified site: Secondary | ICD-10-CM | POA: Diagnosis not present

## 2014-10-13 DIAGNOSIS — Z880 Allergy status to penicillin: Secondary | ICD-10-CM | POA: Diagnosis not present

## 2014-10-13 DIAGNOSIS — S161XXA Strain of muscle, fascia and tendon at neck level, initial encounter: Secondary | ICD-10-CM | POA: Insufficient documentation

## 2014-10-13 DIAGNOSIS — Y998 Other external cause status: Secondary | ICD-10-CM | POA: Diagnosis not present

## 2014-10-13 DIAGNOSIS — S4991XA Unspecified injury of right shoulder and upper arm, initial encounter: Secondary | ICD-10-CM | POA: Insufficient documentation

## 2014-10-13 DIAGNOSIS — S0591XA Unspecified injury of right eye and orbit, initial encounter: Secondary | ICD-10-CM | POA: Insufficient documentation

## 2014-10-13 DIAGNOSIS — S199XXA Unspecified injury of neck, initial encounter: Secondary | ICD-10-CM | POA: Diagnosis present

## 2014-10-13 MED ORDER — OXYCODONE-ACETAMINOPHEN 5-325 MG PO TABS: 1.0000 | ORAL_TABLET | Freq: Four times a day (QID) | ORAL | Status: DC | PRN

## 2014-10-13 MED ORDER — METHOCARBAMOL 500 MG PO TABS
500.0000 mg | ORAL_TABLET | Freq: Once | ORAL | Status: AC
  Administered 2014-10-13: 500 mg via ORAL
  Filled 2014-10-13: qty 1

## 2014-10-13 MED ORDER — OXYCODONE-ACETAMINOPHEN 5-325 MG PO TABS
1.0000 | ORAL_TABLET | Freq: Once | ORAL | Status: AC
  Administered 2014-10-13: 1 via ORAL
  Filled 2014-10-13: qty 1

## 2014-10-13 MED ORDER — METHOCARBAMOL 500 MG PO TABS: 500.0000 mg | ORAL_TABLET | Freq: Three times a day (TID) | ORAL | Status: DC
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
## 2014-10-13 NOTE — Discharge Instructions (Signed)
The CT scan of your head is normal.  The x-rays of your cervical spine and lumbar spine are all within normal parameters.  All of your hardware is in place.  U been given a muscle relaxer and pain control.  Please follow-up with your primary care physician as needed
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
## 2015-08-09 ENCOUNTER — Other Ambulatory Visit: Payer: Self-pay

## 2015-08-09 DIAGNOSIS — Z1231 Encounter for screening mammogram for malignant neoplasm of breast: Secondary | ICD-10-CM

## 2015-08-13 ENCOUNTER — Ambulatory Visit: Payer: BLUE CROSS/BLUE SHIELD

## 2015-09-21 ENCOUNTER — Ambulatory Visit
Admission: RE | Admit: 2015-09-21 | Discharge: 2015-09-21 | Disposition: A | Discharge status: Home / Self Care | Payer: BLUE CROSS/BLUE SHIELD | Source: Ambulatory Visit

## 2015-09-21 DIAGNOSIS — Z1231 Encounter for screening mammogram for malignant neoplasm of breast: Secondary | ICD-10-CM

## 2016-06-04 ENCOUNTER — Other Ambulatory Visit: Payer: Self-pay | Admitting: Gastroenterology

## 2016-06-16 ENCOUNTER — Encounter (HOSPITAL_COMMUNITY): Payer: Self-pay

## 2016-06-18 NOTE — Anesthesia Preprocedure Evaluation (Signed)
Anesthesia Evaluation  Patient identified by MRN, date of birth, ID band Patient awake    Reviewed: Allergy & Precautions, H&P , NPO status , Patient's Chart, lab work & pertinent test results  Airway Mallampati: II  TM Distance: >3 FB Neck ROM: Full    Dental  (+) Teeth Intact, Dental Advisory Given   Pulmonary neg pulmonary ROS,    breath sounds clear to auscultation       Cardiovascular negative cardio ROS   Rhythm:Regular Rate:Normal     Neuro/Psych negative neurological ROS  negative psych ROS   GI/Hepatic negative GI ROS, Neg liver ROS,   Endo/Other  negative endocrine ROS  Renal/GU negative Renal ROS     Musculoskeletal  (+) Arthritis ,   Abdominal   Peds  Hematology negative hematology ROS (+)   Anesthesia Other Findings   Reproductive/Obstetrics negative OB ROS                             Anesthesia Physical  Anesthesia Plan  ASA: II  Anesthesia Plan: MAC   Post-op Pain Management:    Induction: Intravenous  Airway Management Planned: Oral ETT  Additional Equipment:   Intra-op Plan:   Post-operative Plan:   Informed Consent: I have reviewed the patients History and Physical, chart, labs and discussed the procedure including the risks, benefits and alternatives for the proposed anesthesia with the patient or authorized representative who has indicated his/her understanding and acceptance.   Dental advisory given  Plan Discussed with: CRNA  Anesthesia Plan Comments:         Anesthesia Quick Evaluation

## 2016-06-19 ENCOUNTER — Ambulatory Visit (HOSPITAL_COMMUNITY): Payer: BLUE CROSS/BLUE SHIELD | Admitting: Anesthesiology

## 2016-06-19 ENCOUNTER — Ambulatory Visit (HOSPITAL_COMMUNITY)
Admission: RE | Admit: 2016-06-19 | Discharge: 2016-06-19 | Disposition: A | Discharge status: Home / Self Care | Payer: BLUE CROSS/BLUE SHIELD | Source: Ambulatory Visit | Attending: Gastroenterology | Admitting: Gastroenterology

## 2016-06-19 ENCOUNTER — Encounter (HOSPITAL_COMMUNITY)
Admission: RE | Disposition: A | Discharge status: Home / Self Care | Payer: Self-pay | Source: Ambulatory Visit | Attending: Gastroenterology

## 2016-06-19 ENCOUNTER — Encounter (HOSPITAL_COMMUNITY): Payer: Self-pay

## 2016-06-19 DIAGNOSIS — Z7901 Long term (current) use of anticoagulants: Secondary | ICD-10-CM | POA: Insufficient documentation

## 2016-06-19 DIAGNOSIS — Z1211 Encounter for screening for malignant neoplasm of colon: Secondary | ICD-10-CM | POA: Diagnosis not present

## 2016-06-19 DIAGNOSIS — Z79899 Other long term (current) drug therapy: Secondary | ICD-10-CM | POA: Insufficient documentation

## 2016-06-19 DIAGNOSIS — Z880 Allergy status to penicillin: Secondary | ICD-10-CM | POA: Insufficient documentation

## 2016-06-19 DIAGNOSIS — M1990 Unspecified osteoarthritis, unspecified site: Secondary | ICD-10-CM | POA: Diagnosis not present

## 2016-06-19 DIAGNOSIS — Z7982 Long term (current) use of aspirin: Secondary | ICD-10-CM | POA: Diagnosis not present

## 2016-06-19 HISTORY — PX: COLONOSCOPY WITH PROPOFOL: SHX5780

## 2016-06-19 SURGERY — COLONOSCOPY WITH PROPOFOL
Anesthesia: Monitor Anesthesia Care

## 2016-06-19 MED ORDER — PROPOFOL 10 MG/ML IV BOLUS
INTRAVENOUS | Status: AC
  Filled 2016-06-19: qty 40

## 2016-06-19 MED ORDER — LACTATED RINGERS IV SOLN
INTRAVENOUS | Status: DC
  Administered 2016-06-19: 1000 mL via INTRAVENOUS

## 2016-06-19 MED ORDER — PROPOFOL 10 MG/ML IV BOLUS
INTRAVENOUS | Status: DC | PRN
  Administered 2016-06-19: 10 mg via INTRAVENOUS
  Administered 2016-06-19: 20 mg via INTRAVENOUS
  Administered 2016-06-19 (×4): 10 mg via INTRAVENOUS
  Administered 2016-06-19: 20 mg via INTRAVENOUS
  Administered 2016-06-19: 40 mg via INTRAVENOUS
  Administered 2016-06-19: 10 mg via INTRAVENOUS
  Administered 2016-06-19: 20 mg via INTRAVENOUS
  Administered 2016-06-19 (×3): 10 mg via INTRAVENOUS

## 2016-06-19 MED ORDER — SODIUM CHLORIDE 0.9 % IV SOLN: INTRAVENOUS | Status: DC

## 2016-06-19 SURGICAL SUPPLY — 22 items

## 2016-06-19 NOTE — Op Note (Addendum)
[facility] Patient Name: Sara Reyes Procedure Date: 06/19/2016 MRN: [id number] Attending MD: Charna ElizabethJyothi Parsa Rickett , MD Date of Birth: 03/01/1966 CSN: [id number] Age: 50 Admit Type: Outpatient Procedure:                Screening colonoscopy Indications:              CRC screening for colorectal malignant neoplasm. Providers:                Charna ElizabethJyothi Emree Locicero, MD, Roselie AwkwardShannon Love, RN, Clearnce SorrelKatie Smith,                            Technician. Referring MD:             Laurann Montanaynthia White, MD Medicines:                Monitored Anesthesia Care Complications:            No immediate complications. Estimated Blood Loss:     Estimated blood loss: none. Procedure:                Pre-anesthesia assessment: - Prior to the                            procedure, a history and physical was performed,                            and patient medications and allergies were                            reviewed. The patient's tolerance of previous                            anesthesia was also reviewed. The risks and                            benefits of the procedure and the sedation options                            and risks were discussed with the patient. All                            questions were answered, and informed consent was                            obtained. Prior anticoagulants: The patient has                            taken no previous anticoagulant or antiplatelet                            agents. ASA Grade assessment: II - A patient with                            mild systemic disease. After reviewing the risks  and benefits, the patient was deemed in                            satisfactory condition to undergo the procedure.                            After obtaining informed consent, the colonoscope                            was passed under direct vision. Throughout the                            procedure, the patient's blood pressure, pulse, and                    oxygen saturations were monitored continuously. The                            EC-[id number] ([id number]) scope was introduced through                            the anus and advanced to the the terminal ileum,                            with identification of the appendiceal orifice and                            IC valve. The colonoscopy was performed without                            difficulty. The patient tolerated the procedure                            well. The quality of the bowel preparation was                            good. The terminal ileum, the ileocecal valve, the                            appendiceal orifice and the rectum were                            photographed. The bowel preparation used was                            GoLYTELY. Scope In: 7:21:06 AM Scope Out: 7:34:06 AM Scope Withdrawal Time: 0 hours 8 minutes 18 seconds  Total Procedure Duration: 0 hours 13 minutes 0 seconds  Findings:      The entire examined portion of the colon appeared normal.      The terminal ileum appeared normal.      No additional abnormalities were found on retroflexion. Impression:               - The entire examined colon is normal.                           -  The examined portion of the ileum was normal.                           - No specimens collected. Moderate Sedation:      MAC used. Recommendation:           - High fiber diet with augmented water consumption                            daily.                           - Continue present medications.                           - Repeat colonoscopy in 10 years for surveillance.                           - Return to GI office PRN.                           - If the patient has any abnormal GI symptoms in                            the interim, she has been advised to call the                            office ASAP for further recommendations. Procedure Code(s):        --- Professional ---                            [phone], Colonoscopy, flexible; diagnostic, including                            collection of specimen(s) by brushing or washing,                            when performed (separate procedure) Diagnosis Code(s):        --- Professional ---                           Z12.11, Encounter for screening for malignant                            neoplasm of colon CPT copyright 2016 [employer]. All rights reserved. The codes documented in this report are preliminary and upon coder review may  be revised to meet current compliance requirements. Charna ElizabethJyothi Yashua Bracco, MD Charna ElizabethJyothi Matteo Banke, MD 06/19/2016 7:43:13 AM This report has been signed electronically. Number of Addenda: 0

## 2016-06-19 NOTE — Discharge Instructions (Signed)

## 2016-06-19 NOTE — Anesthesia Postprocedure Evaluation (Signed)
Anesthesia Post Note  Patient: Sara Reyes  Procedure(s) Performed: Procedure(s) (LRB): COLONOSCOPY WITH PROPOFOL (N/A)  Patient location during evaluation: PACU Anesthesia Type: MAC Level of consciousness: awake and alert Pain management: pain level controlled Vital Signs Assessment: post-procedure vital signs reviewed and stable Respiratory status: spontaneous breathing Cardiovascular status: stable Anesthetic complications: no    Last Vitals:  Vitals:   06/19/16 0750 06/19/16 0800  BP: 100/64 (!) 127/92  Pulse: 69 74  Resp: 17 17  Temp:      Last Pain:  Vitals:   06/19/16 0738  TempSrc: Oral                 Lewie LoronJohn Janira Mandell

## 2016-06-19 NOTE — H&P (Signed)
Subjective:   Patient is a 50 y.o. [place] female presents to hospital for a screening colonoscopy. See office notes for details..   Patient Active Problem List   Diagnosis Date Noted  . S/P cervical spinal fusion 03/29/2014   Past Medical History:  Diagnosis Date  . Arthritis   . Medical history non-contributory   . Shortness of breath    with exertion    Past Surgical History:  Procedure Laterality Date  . EYE SURGERY Bilateral    lasik  . HAND SURGERY Right    nerve repair  . POSTERIOR CERVICAL FUSION/FORAMINOTOMY N/A 03/29/2014   Procedure: CERVICAL TWO THRU CERVICAL SEVEN Posterior Cervical Fusion w/lateral mass fixation, CERVICAL THREE THRU CERVICAL SEVEN Cervical Laminectomy;  Surgeon: Tia Alertavid S Jones, MD;  Location: [facility];  Service: Neurosurgery;  Laterality: N/A;    Prescriptions Prior to Admission  Medication Sig Dispense Refill Last Dose  . aspirin EC 81 MG tablet Take 81 mg by mouth daily.   Past Week at Unknown time  . Bisacodyl (LAXATIVE PO) Take 2 tablets by mouth daily.   Past Week at Unknown time  . Cholecalciferol (VITAMIN D PO) Take by mouth.   Past Week at Unknown time  . ferrous sulfate 325 (65 FE) MG tablet Take 325 mg by mouth daily with breakfast.   10/12/2014 at Unknown time  . methocarbamol (ROBAXIN) 500 MG tablet Take 1 tablet (500 mg total) by mouth 3 (three) times daily. 20 tablet 0   . Oxycodone HCl 10 MG TABS Take 10 mg by mouth every 4 (four) hours as needed (moderate pain).   Past Week at Unknown time  . oxyCODONE-acetaminophen (PERCOCET/ROXICET) 5-325 MG per tablet Take 1 tablet by mouth every 6 (six) hours as needed for severe pain. 12 tablet 0   . XARELTO STARTER PACK 15 & 20 MG TBPK Take 15-20 mg by mouth as directed. Take as directed on package: Start with one 15mg  tablet by mouth twice a day with food. On Day 22, switch to one 20mg  tablet once a day with food. 51 each 0 10/12/2014 at Unknown time  . zolpidem (AMBIEN) 5 MG tablet Take 5 mg by  mouth at bedtime as needed for sleep (insomnia).   Past Week at Unknown time   Allergies  Allergen Reactions  . Penicillins Rash    Unknown reaction as a child. Has patient had a PCN reaction causing immediate rash, facial/tongue/throat swelling, SOB or lightheadedness with hypotension: Unknown Has patient had a PCN reaction causing severe rash involving mucus membranes or skin necrosis: Unknown Has patient had a PCN reaction that required hospitalization: Unknown Has patient had a PCN reaction occurring within the last 10 years: No If all of the above answers are "NO", then may proceed with Cephalosporin use.   . Sulfa Antibiotics Rash    Social History  Substance Use Topics  . Smoking status: Never Smoker  . Smokeless tobacco: Never Used  . Alcohol use Yes     Comment: socially, maybe once a month    History reviewed. No pertinent family history.  Review of Systems A comprehensive review of systems was negative except for: Musculoskeletal: positive for neck pain   Objective:   Patient Vitals for the past 8 hrs:  BP Temp Temp src Pulse Resp SpO2 Height Weight  06/19/16 0626 117/69 98.1 F (36.7 C) Oral 79 15 100 % 5' (1.524 m) 62.6 kg (138 lb)    BP 117/69   Pulse 79  Temp 98.1 F (36.7 C) (Oral)   Resp 15   Ht 5' (1.524 m)   Wt 62.6 kg (138 lb)   LMP 06/05/2016 Comment: Pt. states not chance of pregnancy  SpO2 100%   BMI 26.95 kg/m   General Appearance:    Alert, cooperative, no distress, appears stated age  Head:    Normocephalic, without obvious abnormality, atraumatic  Eyes:    PERRL, conjunctiva/corneas clear, EOM's intact, fundi    benign, both eyes  Ears:    Normal TM's and external ear canals, both ears  Nose:   Nares normal, septum midline, mucosa normal, no drainage    or sinus tenderness  Throat:   Lips, mucosa, and tongue normal; teeth and gums normal  Neck:   Supple, symmetrical, trachea midline, no adenopathy;    thyroid:  no  enlargement/tenderness/nodules; no carotid   bruit or JVD  Back:     Symmetric, no curvature, ROM normal, no CVA tenderness  Lungs:     Clear to auscultation bilaterally, respirations unlabored  Chest Wall:    No tenderness or deformity   Heart:    Regular rate and rhythm, S1 and S2 normal, no murmur, rub   or gallop  Breast Exam:    No tenderness, masses, or nipple abnormality  Abdomen:     Soft, non-tender, bowel sounds active all four quadrants,    no masses, no organomegaly  Genitalia:    Normal female without lesion, discharge or tenderness  Rectal:    Normal tone, normal prostate, no masses or tenderness;   guaiac negative stool  Extremities:   Extremities normal, atraumatic, no cyanosis or edema  Pulses:   2+ and symmetric all extremities  Skin:   Skin color, texture, turgor normal, no rashes or lesions  Lymph nodes:   Cervical, supraclavicular, and axillary nodes normal  Neurologic:   CNII-XII intact, normal strength, sensation and reflexes    throughout       Assessment:  Colorectal cancer screening: proceed with a colonoscopy at this time.   Plan:  Colonoscopy planned today.

## 2016-06-19 NOTE — Transfer of Care (Signed)
Immediate Anesthesia Transfer of Care Note  Patient: Sara Reyes  Procedure(s) Performed: Procedure(s): COLONOSCOPY WITH PROPOFOL (N/A)  Patient Location: PACU  Anesthesia Type:MAC  Level of Consciousness: sedated  Airway & Oxygen Therapy: Patient Spontanous Breathing and Patient connected to nasal cannula oxygen  Post-op Assessment: Report given to RN and Post -op Vital signs reviewed and stable  Post vital signs: Reviewed and stable  Last Vitals:  Vitals:   06/19/16 0626  BP: 117/69  Pulse: 79  Resp: 15  Temp: 36.7 C    Last Pain:  Vitals:   06/19/16 0626  TempSrc: Oral         Complications: No apparent anesthesia complications

## 2016-06-20 ENCOUNTER — Encounter (HOSPITAL_COMMUNITY): Payer: Self-pay | Admitting: Gastroenterology

## 2016-08-24 ENCOUNTER — Emergency Department (HOSPITAL_COMMUNITY)
Admission: EM | Admit: 2016-08-24 | Discharge: 2016-08-24 | Disposition: A | Discharge status: Home / Self Care | Payer: 59 | Attending: Emergency Medicine | Admitting: Emergency Medicine

## 2016-08-24 ENCOUNTER — Encounter (HOSPITAL_COMMUNITY): Payer: Self-pay | Admitting: Emergency Medicine

## 2016-08-24 DIAGNOSIS — T782XXA Anaphylactic shock, unspecified, initial encounter: Secondary | ICD-10-CM | POA: Diagnosis not present

## 2016-08-24 DIAGNOSIS — Z7982 Long term (current) use of aspirin: Secondary | ICD-10-CM | POA: Diagnosis not present

## 2016-08-24 DIAGNOSIS — Z79899 Other long term (current) drug therapy: Secondary | ICD-10-CM | POA: Insufficient documentation

## 2016-08-24 DIAGNOSIS — L509 Urticaria, unspecified: Secondary | ICD-10-CM | POA: Diagnosis present

## 2016-08-24 DIAGNOSIS — Z7902 Long term (current) use of antithrombotics/antiplatelets: Secondary | ICD-10-CM | POA: Diagnosis not present

## 2016-08-24 LAB — CBC WITH DIFFERENTIAL/PLATELET
BASOS ABS: 0 10*3/uL (ref 0.0–0.1)
Basophils Relative: 0 %
Eosinophils Absolute: 0 10*3/uL (ref 0.0–0.7)
Eosinophils Relative: 0 %
HEMATOCRIT: 38.1 % (ref 36.0–46.0)
HEMOGLOBIN: 12.6 g/dL (ref 12.0–15.0)
LYMPHS ABS: 1.5 10*3/uL (ref 0.7–4.0)
LYMPHS PCT: 9 %
MCH: 27 pg (ref 26.0–34.0)
MCHC: 33.1 g/dL (ref 30.0–36.0)
MCV: 81.6 fL (ref 78.0–100.0)
Monocytes Absolute: 0.6 10*3/uL (ref 0.1–1.0)
Monocytes Relative: 4 %
NEUTROS PCT: 87 %
Neutro Abs: 13.8 10*3/uL — ABNORMAL HIGH (ref 1.7–7.7)
Platelets: 355 10*3/uL (ref 150–400)
RBC: 4.67 MIL/uL (ref 3.87–5.11)
RDW: 13 % (ref 11.5–15.5)
WBC: 15.9 10*3/uL — AB (ref 4.0–10.5)

## 2016-08-24 LAB — BASIC METABOLIC PANEL
ANION GAP: 10 (ref 5–15)
BUN: 13 mg/dL (ref 6–20)
CO2: 22 mmol/L (ref 22–32)
Calcium: 8.4 mg/dL — ABNORMAL LOW (ref 8.9–10.3)
Chloride: 106 mmol/L (ref 101–111)
Creatinine, Ser: 0.76 mg/dL (ref 0.44–1.00)
GFR calc Af Amer: >60 mL/min (ref >60)
GFR calc non Af Amer: >60 mL/min (ref >60)
GLUCOSE: 172 mg/dL — AB (ref 65–99)
POTASSIUM: 3.1 mmol/L — AB (ref 3.5–5.1)
Sodium: 138 mmol/L (ref 135–145)

## 2016-08-24 MED ORDER — METHYLPREDNISOLONE SODIUM SUCC 125 MG IJ SOLR
125.0000 mg | Freq: Once | INTRAMUSCULAR | Status: AC
  Administered 2016-08-24: 125 mg via INTRAVENOUS
  Filled 2016-08-24: qty 2

## 2016-08-24 MED ORDER — EPINEPHRINE 0.3 MG/0.3ML IJ SOAJ
0.3000 mg | Freq: Once | INTRAMUSCULAR | Status: AC
  Administered 2016-08-24: 0.3 mg via INTRAMUSCULAR
  Filled 2016-08-24: qty 0.3

## 2016-08-24 MED ORDER — DIPHENHYDRAMINE HCL 50 MG/ML IJ SOLN
25.0000 mg | Freq: Once | INTRAMUSCULAR | Status: AC
  Administered 2016-08-24: 25 mg via INTRAVENOUS
  Filled 2016-08-24: qty 1

## 2016-08-24 MED ORDER — SODIUM CHLORIDE 0.9 % IV BOLUS (SEPSIS)
1000.0000 mL | Freq: Once | INTRAVENOUS | Status: AC
  Administered 2016-08-24: 1000 mL via INTRAVENOUS

## 2016-08-24 MED ORDER — FAMOTIDINE IN NACL 20-0.9 MG/50ML-% IV SOLN
20.0000 mg | Freq: Once | INTRAVENOUS | Status: AC
  Administered 2016-08-24: 20 mg via INTRAVENOUS
  Filled 2016-08-24: qty 50

## 2016-08-24 MED ORDER — PREDNISONE 10 MG PO TABS: 20.0000 mg | ORAL_TABLET | Freq: Two times a day (BID) | ORAL | 0 refills | Status: DC

## 2016-08-24 MED ORDER — EPINEPHRINE 0.3 MG/0.3ML IJ SOAJ: 0.3000 mg | Freq: Once | INTRAMUSCULAR | 3 refills | Status: AC

## 2016-08-24 NOTE — ED Notes (Signed)
She tells me she is beginning to feel better--less itchy and her throat feels "nearly normal now".

## 2016-08-24 NOTE — Discharge Instructions (Signed)
Take Pepcid, 20 mg, twice a day for 5 days.  Take Claritin 10 mg once a day for 5 days.  Get plenty of rest and drink a lot of fluids.

## 2016-08-24 NOTE — ED Notes (Signed)
Her urticaria and itching are resolved and she tells me she feels "great". Her breathing remains normal also.

## 2016-08-24 NOTE — ED Triage Notes (Signed)
Patient presents with rash/hives starting yesterday at 16:30. Patient took 2 benadryl last night which helped with itching. Patient reports shortness of breath and lump in her throat starting this morning.

## 2016-08-24 NOTE — ED Provider Notes (Addendum)
[facility]-EMERGENCY DEPT Provider Note   CSN: [id number] Arrival date & time: 08/24/16  1129     History   Chief Complaint Chief Complaint  Patient presents with  . Allergic Reaction    HPI Sara Reyes is a 51 y.o. female.\  She complains of itching that started after she left the movie theater last night. She took Benadryl without relief. She noticed hives which started last night and has noticed more hives today. Is not taking any other medications. She was waiting to see a doctor, which she noticed her voice becomes hoarse, when she saw the doctor they sent her here without giving her additional treatment. No prior history of anaphylaxis. She does not know if she had a specific allergy exposure. No recent fever, chills, nausea or vomiting. She denies weakness or dizziness.  HPI  Past Medical History:  Diagnosis Date  . Arthritis   . Medical history non-contributory   . Shortness of breath    with exertion    Patient Active Problem List   Diagnosis Date Noted  . S/P cervical spinal fusion 03/29/2014    Past Surgical History:  Procedure Laterality Date  . COLONOSCOPY WITH PROPOFOL N/A 06/19/2016   Procedure: COLONOSCOPY WITH PROPOFOL;  Surgeon: Charna Elizabeth, MD;  Location: [facility];  Service: Endoscopy;  Laterality: N/A;  . EYE SURGERY Bilateral    lasik  . HAND SURGERY Right    nerve repair  . POSTERIOR CERVICAL FUSION/FORAMINOTOMY N/A 03/29/2014   Procedure: CERVICAL TWO THRU CERVICAL SEVEN Posterior Cervical Fusion w/lateral mass fixation, CERVICAL THREE THRU CERVICAL SEVEN Cervical Laminectomy;  Surgeon: Tia Alert, MD;  Location: [facility];  Service: Neurosurgery;  Laterality: N/A;    OB History    No data available       Home Medications    Prior to Admission medications   Medication Sig Start Date End Date Taking? Authorizing Provider  aspirin EC 81 MG tablet Take 81 mg by mouth daily.   Yes Historical Provider, MD  Glucosamine-Chondroitin  (GLUCOSAMINE CHONDR COMPLEX PO) Take 1 tablet by mouth daily.   Yes Historical Provider, MD  EPINEPHrine 0.3 mg/0.3 mL IJ SOAJ injection Inject 0.3 mLs (0.3 mg total) into the muscle once. For severe allergic reaction 08/24/16 08/24/16  Mancel Bale, MD  methocarbamol (ROBAXIN) 500 MG tablet Take 1 tablet (500 mg total) by mouth 3 (three) times daily. Patient not taking: Reported on 08/24/2016 10/13/14   Earley Favor, NP  oxyCODONE-acetaminophen (PERCOCET/ROXICET) 5-325 MG per tablet Take 1 tablet by mouth every 6 (six) hours as needed for severe pain. Patient not taking: Reported on 08/24/2016 10/13/14   Earley Favor, NP  predniSONE (DELTASONE) 10 MG tablet Take 2 tablets (20 mg total) by mouth 2 (two) times daily. 08/24/16   Mancel Bale, MD  XARELTO STARTER PACK 15 & 20 MG TBPK Take 15-20 mg by mouth as directed. Take as directed on package: Start with one 15mg  tablet by mouth twice a day with food. On Day 22, switch to one 20mg  tablet once a day with food. Patient not taking: Reported on 08/24/2016 04/13/14   Doug Sou, MD    Family History No family history on file.  Social History Social History  Substance Use Topics  . Smoking status: Never Smoker  . Smokeless tobacco: Never Used  . Alcohol use Yes     Comment: socially, maybe once a month     Allergies   Penicillins and Sulfa antibiotics   Review of Systems  Review of Systems  All other systems reviewed and are negative.    Physical Exam Updated Vital Signs BP 119/63   Pulse (!) 127   Temp 98.5 F (36.9 C)   Resp 26   Ht 5' (1.524 m)   Wt 136 lb (61.7 kg)   LMP 08/17/2016   SpO2 100%   BMI 26.56 kg/m   Physical Exam  Constitutional: She is oriented to person, place, and time. She appears well-developed and well-nourished. No distress.  Voice is hoarse   HENT:  Head: Normocephalic and atraumatic.  Eyes: Conjunctivae and EOM are normal. Pupils are equal, round, and reactive to light.  Neck: Normal range of motion  and phonation normal. Neck supple. No tracheal deviation present.  No stridor  Cardiovascular: Normal rate and regular rhythm.   Pulmonary/Chest: Effort normal and breath sounds normal. No respiratory distress. She has no wheezes. She exhibits no tenderness.  Abdominal: Soft. She exhibits no distension. There is no tenderness. There is no guarding.  Musculoskeletal: Normal range of motion.  Neurological: She is alert and oriented to person, place, and time. She exhibits normal muscle tone.  Skin: Skin is warm and dry.  Generalized urticaria  Psychiatric: She has a normal mood and affect. Her behavior is normal. Judgment and thought content normal.  Nursing note and vitals reviewed.    ED Treatments / Results  Labs (all labs ordered are listed, but only abnormal results are displayed) Labs Reviewed  CBC WITH DIFFERENTIAL/PLATELET - Abnormal; Notable for the following:       Result Value   WBC 15.9 (*)    Neutro Abs 13.8 (*)    All other components within normal limits  BASIC METABOLIC PANEL - Abnormal; Notable for the following:    Potassium 3.1 (*)    Glucose, Bld 172 (*)    Calcium 8.4 (*)    All other components within normal limits    EKG  EKG Interpretation  Date/Time:  Sunday August 24 2016 11:45:58 EST Ventricular Rate:  127 PR Interval:    QRS Duration: 94 QT Interval:  315 QTC Calculation: 458 R Axis:   77 Text Interpretation:  Sinus tachycardia Low voltage, precordial leads Minimal ST depression, diffuse leads Baseline wander in lead(s) II III aVF Since last tracing rate faster Confirmed by [facility]  MD, Alexah Kivett ([id number]) on 08/24/2016 12:57:29 PM       Radiology No results found.  Procedures Procedures (including critical care time)  Medications Ordered in ED Medications  EPINEPHrine (EPI-PEN) injection 0.3 mg (0.3 mg Intramuscular Given 08/24/16 1201)  methylPREDNISolone sodium succinate (SOLU-MEDROL) 125 mg/2 mL injection 125 mg (125 mg Intravenous Given  08/24/16 1207)  famotidine (PEPCID) IVPB 20 mg premix (0 mg Intravenous Stopped 08/24/16 1352)  diphenhydrAMINE (BENADRYL) injection 25 mg (25 mg Intravenous Given 08/24/16 1207)  sodium chloride 0.9 % bolus 1,000 mL (1,000 mLs Intravenous New Bag/Given 08/24/16 1441)     Initial Impression / Assessment and Plan / ED Course  I have reviewed the triage vital signs and the nursing notes.  Pertinent labs & imaging results that were available during my care of the patient were reviewed by me and considered in my medical decision making (see chart for details).     Medications  EPINEPHrine (EPI-PEN) injection 0.3 mg (0.3 mg Intramuscular Given 08/24/16 1201)  methylPREDNISolone sodium succinate (SOLU-MEDROL) 125 mg/2 mL injection 125 mg (125 mg Intravenous Given 08/24/16 1207)  famotidine (PEPCID) IVPB 20 mg premix (0 mg Intravenous Stopped 08/24/16 1352)  diphenhydrAMINE (BENADRYL) injection 25 mg (25 mg Intravenous Given 08/24/16 1207)  sodium chloride 0.9 % bolus 1,000 mL (1,000 mLs Intravenous New Bag/Given 08/24/16 1441)    Patient Vitals for the past 24 hrs:  BP Temp Pulse Resp SpO2 Height Weight  08/24/16 1330 119/63 - (!) 127 26 100 % - -  08/24/16 1300 127/64 - (!) 129 19 99 % - -  08/24/16 1230 134/69 - 119 19 99 % - -  08/24/16 1210 - - (!) 124 14 95 % - -  08/24/16 1200 99/70 - (!) 146 24 93 % - -  08/24/16 1150 144/85 - - 16 - - -  08/24/16 1144 - - - - - 5' (1.524 m) 136 lb (61.7 kg)  08/24/16 1141 137/90 98.5 F (36.9 C) (!) 137 20 93 % - -    4:03 PM Reevaluation with update and discussion. After initial assessment and treatment, an updated evaluation reveals Heart rate now 102 with blood pressure normal. Patient is more comfortable and voice sounds more normal. Findings discussed with the patient and all questions were answered. Shasha Buchbinder L     CRITICAL CARE Performed by: Mancel BaleWENTZ,Crosby Oriordan L Total critical care time: 35 minutes minutes Critical care time was exclusive of  separately billable procedures and treating other patients. Critical care was necessary to treat or prevent imminent or life-threatening deterioration. Critical care was time spent personally by me on the following activities: development of treatment plan with patient and/or surrogate as well as nursing, discussions with consultants, evaluation of patient's response to treatment, examination of patient, obtaining history from patient or surrogate, ordering and performing treatments and interventions, ordering and review of laboratory studies, ordering and review of radiographic studies, pulse oximetry and re-evaluation of patient's condition.   Final Clinical Impressions(s) / ED Diagnoses   Final diagnoses:  Anaphylaxis, initial encounter   Anaphylaxis, etiology not clear. Likely allergic. Approved after treatment stable for discharge.  Nursing Notes Reviewed/ Care Coordinated Applicable Imaging Reviewed Interpretation of Laboratory Data incorporated into ED treatment  The patient appears reasonably screened and/or stabilized for discharge and I doubt any other medical condition or other [facility] requiring further screening, evaluation, or treatment in the ED at this time prior to discharge.  Plan: Home Medications- continue; Home Treatments- rest, fluids; return here if the recommended treatment, does not improve the symptoms; Recommended follow up- PCP prn    New Prescriptions New Prescriptions   EPINEPHRINE 0.3 MG/0.3 ML IJ SOAJ INJECTION    Inject 0.3 mLs (0.3 mg total) into the muscle once. For severe allergic reaction   PREDNISONE (DELTASONE) 10 MG TABLET    Take 2 tablets (20 mg total) by mouth 2 (two) times daily.     Mancel BaleElliott Chalsea Darko, MD 08/24/16 1604    Mancel BaleElliott Merrilyn Legler, MD 08/24/16 [id number]

## 2016-08-26 ENCOUNTER — Encounter: Payer: Self-pay | Admitting: Allergy and Immunology

## 2016-08-26 ENCOUNTER — Ambulatory Visit (INDEPENDENT_AMBULATORY_CARE_PROVIDER_SITE_OTHER): Payer: 59 | Admitting: Allergy and Immunology

## 2016-08-26 VITALS — BP 118/76 | HR 82 | Temp 98.3°F | Resp 14 | Ht 59.5 in | Wt 137.0 lb

## 2016-08-26 DIAGNOSIS — T7840XA Allergy, unspecified, initial encounter: Secondary | ICD-10-CM | POA: Insufficient documentation

## 2016-08-26 DIAGNOSIS — L500 Allergic urticaria: Secondary | ICD-10-CM | POA: Diagnosis not present

## 2016-08-26 DIAGNOSIS — T783XXA Angioneurotic edema, initial encounter: Secondary | ICD-10-CM | POA: Diagnosis not present

## 2016-08-26 MED ORDER — RANITIDINE HCL 150 MG PO TABS: ORAL_TABLET | ORAL | 5 refills | Status: DC

## 2016-08-26 MED ORDER — PREDNISONE 1 MG PO TABS: 10.0000 mg | ORAL_TABLET | Freq: Every day | ORAL | Status: DC

## 2016-08-26 MED ORDER — LEVOCETIRIZINE DIHYDROCHLORIDE 5 MG PO TABS: 5.0000 mg | ORAL_TABLET | Freq: Every evening | ORAL | 5 refills | Status: DC

## 2016-08-26 NOTE — Patient Instructions (Addendum)
Allergic reaction The patient's history suggests allergic reaction with an unclear trigger.   We were unable to perform skin tests today as the patient is still in the refractory period after the presumed allergic reaction.  To avoid false negatives, she will return 6 weeks from the time of the reaction for allergy skin testing.  For now, continue meticulous avoidance of peanuts and tree nuts.  Prednisone has been provided, 40 mg x3 days, 20 mg x1 day, 10 mg x1 day, then stop.  Instructions have been discussed and provided for H1/H2 receptor blockade with titration to find lowest effective dose.  Should symptoms recur, a journal is to be kept recording any foods eaten, beverages consumed, medications taken, activities performed, and environmental conditions within a 6 hour time period prior to the onset of symptoms. For any symptoms concerning for anaphylaxis, epinephrine is to be administered and 911 is to be called immediately.   Further recommendations will be made based upon skin test results.   Return in about 6 weeks (around 10/07/2016) for allergy skin testing.  Urticaria (Hives)  . Levocetirizine (Xyzal) 5 mg twice a day and ranitidine (Zantac) 150 mg twice a day. If no symptoms for 7-14 days then decrease to. . Levocetirizine (Xyzal) 5 mg twice a day and ranitidine (Zantac) 150 mg once a day.  If no symptoms for 7-14 days then decrease to. . Levocetirizine (Xyzal) 5 mg twice a day.  If no symptoms for 7-14 days then decrease to. . Levocetirizine (Xyzal) 5 mg once a day.  May use Benadryl (diphenhydramine) as needed for breakthrough symptoms       If symptoms return, then step up dosage

## 2016-08-26 NOTE — Assessment & Plan Note (Signed)
The patient's history suggests allergic reaction with an unclear trigger.   We were unable to perform skin tests today as the patient is still in the refractory period after the presumed allergic reaction.  To avoid false negatives, she will return 6 weeks from the time of the reaction for allergy skin testing.  For now, continue meticulous avoidance of peanuts and tree nuts.  Prednisone has been provided, 40 mg x3 days, 20 mg x1 day, 10 mg x1 day, then stop.  Instructions have been discussed and provided for H1/H2 receptor blockade with titration to find lowest effective dose.  Should symptoms recur, a journal is to be kept recording any foods eaten, beverages consumed, medications taken, activities performed, and environmental conditions within a 6 hour time period prior to the onset of symptoms. For any symptoms concerning for anaphylaxis, epinephrine is to be administered and 911 is to be called immediately.   Further recommendations will be made based upon skin test results.

## 2016-08-26 NOTE — Progress Notes (Signed)
New Patient Note  RE: Sara Reyes MRN: [id number] DOB: February 25, 1966 Date of Office Visit: 08/26/2016  Referring provider: Laurann Montana, MD Primary care provider: Cala Bradford, MD  Chief Complaint: Allergic Reaction; Urticaria; Pruritus; and Angioedema   History of present illness: Sara Reyes is a 51 y.o. female seen today in consultation requested by Laurann Montana, MD.  She reports that 4 days ago she was in a movie theater, without food or beverage, and began to develop pruritus of her hands.  This progressed to hand swelling, hives on the back of her legs, generalized pruritus/urticaria.  There was no evidence of insect sting or bite. She took diphenhydramine with some symptom relief, however the next morning she woke up with swelling of both hands, swelling of the right arm, and in addition became nauseated and vomited.  She went to the walk-in clinic, however prior to treatment there was further progression of flushing and hives on her neck as well as vocal changes.  She was sent to the emergency department where she was treated with epinephrine, intravenous corticosteroid, and intravenous diphenhydramine.  Despite these medical interventions, she is still experiencing generalized urticaria and pruritus.  Prior to the initial onset of symptoms she had consumed coffee, cereal containing peanuts and tree nuts, chips with tumeric, and a rice flour tortilla with jalapeno pepper.  She notes that she had consumed these foods on a daily basis without previous problems.  She has filled her prescription that she received from the emergency department for epinephrine autoinjector 2 pack.   Assessment and plan: Allergic reaction The patient's history suggests allergic reaction with an unclear trigger.   We were unable to perform skin tests today as the patient is still in the refractory period after the presumed allergic reaction.  To avoid false negatives, she will return 6 weeks  from the time of the reaction for allergy skin testing.  For now, continue meticulous avoidance of peanuts and tree nuts.  Prednisone has been provided, 40 mg x3 days, 20 mg x1 day, 10 mg x1 day, then stop.  Instructions have been discussed and provided for H1/H2 receptor blockade with titration to find lowest effective dose.  Should symptoms recur, a journal is to be kept recording any foods eaten, beverages consumed, medications taken, activities performed, and environmental conditions within a 6 hour time period prior to the onset of symptoms. For any symptoms concerning for anaphylaxis, epinephrine is to be administered and 911 is to be called immediately.   Further recommendations will be made based upon skin test results.   Meds ordered this encounter  Medications  . levocetirizine (XYZAL) 5 MG tablet    Sig: Take 1 tablet (5 mg total) by mouth every evening. Or as directed    Dispense:  30 tablet    Refill:  5  . ranitidine (ZANTAC) 150 MG tablet    Sig: 1 tablet daily or as directed.    Dispense:  30 tablet    Refill:  5  . predniSONE (DELTASONE) tablet 10 mg      Physical examination: Blood pressure 118/76, pulse 82, temperature 98.3 F (36.8 C), temperature source Oral, resp. rate 14, height 4' 11.5" (1.511 m), weight 137 lb (62.1 kg), last menstrual period 08/17/2016, SpO2 99 %.  General: Alert, interactive, in no acute distress. Neck: Supple without lymphadenopathy. Lungs: Clear to auscultation without wheezing, rhonchi or rales. CV: Normal S1, S2 without murmurs. Abdomen: Nondistended, nontender. Skin: Scattered erythematous urticarial type lesions primarily  located anterior neck and upper chest , nonvesicular. Extremities:  No clubbing, cyanosis or edema. Neuro:   Grossly intact.  Review of systems:  Review of systems negative except as noted in HPI / PMHx or noted below: Review of Systems  Constitutional: Negative.   HENT: Negative.   Eyes: Negative.     Respiratory: Negative.   Cardiovascular: Negative.   Gastrointestinal: Negative.   Genitourinary: Negative.   Musculoskeletal: Negative.   Skin: Negative.   Neurological: Negative.   Endo/Heme/Allergies: Negative.   Psychiatric/Behavioral: Negative.     Past medical history:  Past Medical History:  Diagnosis Date  . Angio-edema   . Arthritis   . Medical history non-contributory   . Shortness of breath    with exertion  . Urticaria     Past surgical history:  Past Surgical History:  Procedure Laterality Date  . COLONOSCOPY WITH PROPOFOL N/A 06/19/2016   Procedure: COLONOSCOPY WITH PROPOFOL;  Surgeon: Charna Elizabeth, MD;  Location: [facility];  Service: Endoscopy;  Laterality: N/A;  . EYE SURGERY Bilateral    lasik  . HAND SURGERY Right    nerve repair  . POSTERIOR CERVICAL FUSION/FORAMINOTOMY N/A 03/29/2014   Procedure: CERVICAL TWO THRU CERVICAL SEVEN Posterior Cervical Fusion w/lateral mass fixation, CERVICAL THREE THRU CERVICAL SEVEN Cervical Laminectomy;  Surgeon: Tia Alert, MD;  Location: [facility];  Service: Neurosurgery;  Laterality: N/A;    Family history: Family History  Problem Relation Age of Onset  . Diabetes Mother   . Hypertension Mother   . Hypertension Father   . Diabetes Father   . Allergic rhinitis Neg Hx   . Asthma Neg Hx   . Eczema Neg Hx   . Urticaria Neg Hx     Social history: Social History   Social History  . Marital status: Divorced    Spouse name: N/A  . Number of children: N/A  . Years of education: N/A   Occupational History  . Not on file.   Social History Main Topics  . Smoking status: Never Smoker  . Smokeless tobacco: Never Used  . Alcohol use Yes     Comment: socially, maybe once a month  . Drug use: No  . Sexual activity: Not on file   Other Topics Concern  . Not on file   Social History Narrative  . No narrative on file   Environmental History: The patient lives in a house with carpeting in the bedroom,  gas heat, and central air.  There is a dog and parrot in the home, though no pets have access to her bedroom.  She is a nonsmoker.  Allergies as of 08/26/2016      Reactions   Penicillins Rash   Unknown reaction as a child. Has patient had a PCN reaction causing immediate rash, facial/tongue/throat swelling, SOB or lightheadedness with hypotension: Unknown Has patient had a PCN reaction causing severe rash involving mucus membranes or skin necrosis: Unknown Has patient had a PCN reaction that required hospitalization: Unknown Has patient had a PCN reaction occurring within the last 10 years: No If all of the above answers are "NO", then may proceed with Cephalosporin use.   Sulfa Antibiotics Rash      Medication List       Accurate as of 08/26/16  6:21 PM. Always use your most recent med list.          aspirin EC 81 MG tablet Take 81 mg by mouth daily.   EPINEPHrine 0.3 mg/0.3 mL  Soaj injection Commonly known as:  EPI-PEN   GLUCOSAMINE CHONDR COMPLEX PO Take 1 tablet by mouth daily.   levocetirizine 5 MG tablet Commonly known as:  XYZAL Take 1 tablet (5 mg total) by mouth every evening. Or as directed   predniSONE 10 MG tablet Commonly known as:  DELTASONE Take 2 tablets (20 mg total) by mouth 2 (two) times daily.   ranitidine 150 MG tablet Commonly known as:  ZANTAC 1 tablet daily or as directed.       Known medication allergies: Allergies  Allergen Reactions  . Penicillins Rash    Unknown reaction as a child. Has patient had a PCN reaction causing immediate rash, facial/tongue/throat swelling, SOB or lightheadedness with hypotension: Unknown Has patient had a PCN reaction causing severe rash involving mucus membranes or skin necrosis: Unknown Has patient had a PCN reaction that required hospitalization: Unknown Has patient had a PCN reaction occurring within the last 10 years: No If all of the above answers are "NO", then may proceed with Cephalosporin use.     . Sulfa Antibiotics Rash    I appreciate the opportunity to take part in Drianna's care. Please do not hesitate to contact me with questions.  Sincerely,   R. Jorene Guestarter Gioia Ranes, MD

## 2016-08-28 ENCOUNTER — Telehealth: Payer: Self-pay | Admitting: Allergy and Immunology

## 2016-08-28 NOTE — Telephone Encounter (Signed)
Pt called and said that she is broken out all over, and dont know what to do .she was seen Tuesday and she is taking the predisone. [facility] [address]. [phone].

## 2016-08-28 NOTE — Telephone Encounter (Signed)
Noted. Thanks.

## 2016-08-28 NOTE — Telephone Encounter (Signed)
I have spoken with the patient. She states that she is doing the Xyxal 5 mg bid and Zantac 150 mg bid. She says that the hives and itching were worse this morning. Covered her face. Says that she is getting a bit better as the day goes. On. She would like to call us back tomorrow and let us know about the prednisone. I did advise that is she has symptoms consistent with anaphylaxis to go to ED for treatment immediately.

## 2016-08-28 NOTE — Telephone Encounter (Signed)
You may give her some additional prednisone so that she is taking 50 mg for 3 days, 40 mg for 3 days, 30 mg for 3 days, 20 mg for 3 days, then 10 mg for 3 days, then stop.  Also, have her take her full H1/H2 receptor blockade including levocetirizine 5 mg twice daily and ranitidine 150 mg twice daily.  Other then this, there is not much more we can do from this end.  If she has symptoms consistent with anaphylaxis, including chest tightness, dyspnea, nausea, vomiting, etc. she needs to proceed to emergency department for immediate medical attention.

## 2016-09-04 ENCOUNTER — Telehealth: Payer: Self-pay | Admitting: Allergy and Immunology

## 2016-09-04 NOTE — Telephone Encounter (Signed)
Pt called and wants to know if she needs to stay on the Xyzal, Zantac . [phone].

## 2016-09-04 NOTE — Telephone Encounter (Signed)
Spoke to patient and informed her to complete the course of medication and if hives do not return then she can stop.

## 2016-09-05 ENCOUNTER — Other Ambulatory Visit: Payer: Self-pay | Admitting: Allergy and Immunology

## 2016-09-05 DIAGNOSIS — T7840XA Allergy, unspecified, initial encounter: Secondary | ICD-10-CM

## 2016-09-05 MED ORDER — LEVOCETIRIZINE DIHYDROCHLORIDE 5 MG PO TABS: 5.0000 mg | ORAL_TABLET | Freq: Every evening | ORAL | 3 refills | Status: DC

## 2016-09-05 MED ORDER — RANITIDINE HCL 150 MG PO TABS: ORAL_TABLET | ORAL | 3 refills | Status: DC

## 2016-09-05 NOTE — Telephone Encounter (Signed)
Patient called and said a 30 day supply of Xyzal and Zantac were called in for her, but she said this will not be enough. She has not picked up the 30 day supply and wants to know if a 60 or 90 day supply could be sent in instead. Pharm[facility] [address].

## 2016-09-05 NOTE — Telephone Encounter (Signed)
Patient informed and advised on medictaions being sent in for 60 tabs.

## 2016-09-26 ENCOUNTER — Other Ambulatory Visit: Payer: Self-pay | Admitting: Family Medicine

## 2016-09-26 ENCOUNTER — Ambulatory Visit
Admission: RE | Admit: 2016-09-26 | Discharge: 2016-09-26 | Disposition: A | Discharge status: Home / Self Care | Payer: 59 | Source: Ambulatory Visit | Attending: Family Medicine | Admitting: Family Medicine

## 2016-09-26 DIAGNOSIS — Z1231 Encounter for screening mammogram for malignant neoplasm of breast: Secondary | ICD-10-CM

## 2016-10-01 ENCOUNTER — Encounter: Payer: Self-pay | Admitting: Allergy and Immunology

## 2016-10-01 ENCOUNTER — Ambulatory Visit (INDEPENDENT_AMBULATORY_CARE_PROVIDER_SITE_OTHER): Payer: 59 | Admitting: Allergy and Immunology

## 2016-10-01 VITALS — BP 122/74 | HR 60 | Temp 98.5°F | Resp 16

## 2016-10-01 DIAGNOSIS — L500 Allergic urticaria: Secondary | ICD-10-CM

## 2016-10-01 DIAGNOSIS — T7840XD Allergy, unspecified, subsequent encounter: Secondary | ICD-10-CM | POA: Diagnosis not present

## 2016-10-01 NOTE — Assessment & Plan Note (Addendum)
Unclear etiology.  The history suggests the possibility of a reaction to Asian Needle Ant sting, however there are no diagnostic studies to rule this out.  Environmental and food allergen skin tests were negative today despite a positive histamine control.  The negative predictive value of food allergen skin testing is excellent (approximately 95%).  Screening lab work was discussed and offered but the patient would prefer to carry epinephrine autoinjectors and defer labs unless symptoms recur.   Should significant symptoms recur or new symptoms occur, a journal is to be kept recording any foods eaten, beverages consumed, medications taken, activities performed, and environmental conditions within a 6 hour time period prior to the onset of symptoms. For any symptoms concerning for anaphylaxis, epinephrine is to be administered and 911 is to be called immediately.

## 2016-10-01 NOTE — Patient Instructions (Signed)
Allergic reaction Unclear etiology.  The history suggests the possibility of a reaction to Asian Needle Ant sting, however there are no diagnostic studies to rule this out.  Environmental and food allergen skin tests were negative today despite a positive histamine control.  The negative predictive value of food allergen skin testing is excellent (approximately 95%).  Screening lab work was discussed and offered but the patient would prefer to carry epinephrine autoinjectors and defer labs unless symptoms recur.   Should significant symptoms recur or new symptoms occur, a journal is to be kept recording any foods eaten, beverages consumed, medications taken, activities performed, and environmental conditions within a 6 hour time period prior to the onset of symptoms. For any symptoms concerning for anaphylaxis, epinephrine is to be administered and 911 is to be called immediately.    Return if symptoms worsen or fail to improve.

## 2016-10-01 NOTE — Progress Notes (Signed)
Follow-up Note  RE: Sara Reyes MRN: [id number] DOB: 1966/03/17 Date of Office Visit: 10/01/2016  Primary care provider: Cala Bradford, MD Referring provider: Laurann Montana, MD  History of present illness: Sara Reyes is a 51 y.o. female with a history of urticaria/allergic reaction presenting today for follow up and allergy skin testing.  She has been off of antihistamines for at least 3 days in anticipation of today's testing.  She has had no recurrence of symptoms since the initial episode.  Please see history of present illness from 08/26/2016 for details.  She attended her history this morning by stating that the initial symptom was a very slight stinging/tingling sensation between the fingers on her right hand and her symptoms progressed from there.  She initially thought that she had been stung or bitten by an insect.  It was dark in the theater so she was unable to see if an insect was present.   Assessment and plan: Allergic reaction Unclear etiology.  The history suggests the possibility of a reaction to Asian Needle Ant sting, however there are no diagnostic studies to rule this out.  Environmental and food allergen skin tests were negative today despite a positive histamine control.  The negative predictive value of food allergen skin testing is excellent (approximately 95%).  Screening lab work was discussed and offered but the patient would prefer to carry epinephrine autoinjectors and defer labs unless symptoms recur.   Should significant symptoms recur or new symptoms occur, a journal is to be kept recording any foods eaten, beverages consumed, medications taken, activities performed, and environmental conditions within a 6 hour time period prior to the onset of symptoms. For any symptoms concerning for anaphylaxis, epinephrine is to be administered and 911 is to be called immediately.    Diagnostics: Environmental skin testing:  Negative despite a positive  histamine control. Food allergen skin testing:  Negative despite a positive histamine control.    Physical examination: Blood pressure 122/74, pulse 60, temperature 98.5 F (36.9 C), temperature source Oral, resp. rate 16, last menstrual period 09/22/2016, SpO2 97 %.  General: Alert, interactive, in no acute distress. Neck: Supple without lymphadenopathy. Lungs: Clear to auscultation without wheezing, rhonchi or rales. CV: Normal S1, S2 without murmurs. Skin: Warm and dry, without lesions or rashes.  The following portions of the patient's history were reviewed and updated as appropriate: allergies, current medications, past family history, past medical history, past social history, past surgical history and problem list.  Allergies as of 10/01/2016      Reactions   Penicillins Rash   Unknown reaction as a child. Has patient had a PCN reaction causing immediate rash, facial/tongue/throat swelling, SOB or lightheadedness with hypotension: Unknown Has patient had a PCN reaction causing severe rash involving mucus membranes or skin necrosis: Unknown Has patient had a PCN reaction that required hospitalization: Unknown Has patient had a PCN reaction occurring within the last 10 years: No If all of the above answers are "NO", then may proceed with Cephalosporin use.   Sulfa Antibiotics Rash      Medication List       Accurate as of 10/01/16 11:14 AM. Always use your most recent med list.          aspirin EC 81 MG tablet Take 81 mg by mouth daily.   EPINEPHrine 0.3 mg/0.3 mL Soaj injection Commonly known as:  EPI-PEN   GLUCOSAMINE CHONDR COMPLEX PO Take 1 tablet by mouth daily.   levocetirizine 5 MG tablet  Commonly known as:  XYZAL Take 1 tablet (5 mg total) by mouth every evening. Or as directed   ranitidine 150 MG tablet Commonly known as:  ZANTAC 1 tablet daily or as directed.       Allergies  Allergen Reactions  . Penicillins Rash    Unknown reaction as a  child. Has patient had a PCN reaction causing immediate rash, facial/tongue/throat swelling, SOB or lightheadedness with hypotension: Unknown Has patient had a PCN reaction causing severe rash involving mucus membranes or skin necrosis: Unknown Has patient had a PCN reaction that required hospitalization: Unknown Has patient had a PCN reaction occurring within the last 10 years: No If all of the above answers are "NO", then may proceed with Cephalosporin use.   . Sulfa Antibiotics Rash    I appreciate the opportunity to take part in Maciel's care. Please do not hesitate to contact me with questions.  Sincerely,   R. Jorene Guestarter Tera Pellicane, MD

## 2016-11-14 ENCOUNTER — Telehealth: Payer: Self-pay | Admitting: Allergy and Immunology

## 2016-11-14 NOTE — Telephone Encounter (Signed)
Patient needs a detailed statement of all of her visits to our practice. Please call her back. Thanks

## 2017-04-08 NOTE — Telephone Encounter (Signed)
Sara Reyes will mail to pt - kt °

## 2017-07-13 ENCOUNTER — Emergency Department (HOSPITAL_COMMUNITY)
Admission: EM | Admit: 2017-07-13 | Discharge: 2017-07-13 | Disposition: A | Discharge status: Home / Self Care | Payer: 59 | Attending: Emergency Medicine | Admitting: Emergency Medicine

## 2017-07-13 ENCOUNTER — Encounter (HOSPITAL_COMMUNITY): Payer: Self-pay

## 2017-07-13 ENCOUNTER — Emergency Department (HOSPITAL_COMMUNITY): Payer: 59

## 2017-07-13 DIAGNOSIS — M545 Low back pain: Secondary | ICD-10-CM | POA: Diagnosis present

## 2017-07-13 DIAGNOSIS — M5431 Sciatica, right side: Secondary | ICD-10-CM | POA: Diagnosis not present

## 2017-07-13 DIAGNOSIS — Z7982 Long term (current) use of aspirin: Secondary | ICD-10-CM | POA: Insufficient documentation

## 2017-07-13 DIAGNOSIS — Z79899 Other long term (current) drug therapy: Secondary | ICD-10-CM | POA: Insufficient documentation

## 2017-07-13 MED ORDER — CYCLOBENZAPRINE HCL 10 MG PO TABS
10.0000 mg | ORAL_TABLET | Freq: Once | ORAL | Status: AC
  Administered 2017-07-13: 10 mg via ORAL
  Filled 2017-07-13: qty 1

## 2017-07-13 MED ORDER — PREDNISONE 20 MG PO TABS
60.0000 mg | ORAL_TABLET | Freq: Once | ORAL | Status: AC
  Administered 2017-07-13: 60 mg via ORAL
  Filled 2017-07-13: qty 3

## 2017-07-13 MED ORDER — PREDNISONE 50 MG PO TABS: ORAL_TABLET | ORAL | 0 refills | Status: AC

## 2017-07-13 MED ORDER — DIAZEPAM 5 MG PO TABS: 5.0000 mg | ORAL_TABLET | Freq: Two times a day (BID) | ORAL | 0 refills | Status: AC

## 2017-07-13 MED ORDER — DIAZEPAM 5 MG PO TABS
5.0000 mg | ORAL_TABLET | Freq: Once | ORAL | Status: AC
  Administered 2017-07-13: 5 mg via ORAL
  Filled 2017-07-13: qty 1

## 2017-07-13 NOTE — Discharge Instructions (Signed)
Do not take the muscle relaxer when driving or at work as it can make you sleepy. Follow up with your doctor. Return here as needed.

## 2017-07-13 NOTE — ED Triage Notes (Signed)
Pt complains of lower back pain that radiates to the right side and right hip since earlier today

## 2017-07-13 NOTE — ED Provider Notes (Signed)
[facility]-EMERGENCY DEPT Provider Note   CSN: [id number] Arrival date & time: 07/13/17  1859     History   Chief Complaint Chief Complaint  Patient presents with  . Back Pain    HPI Sara Reyes is a 51 y.o. female who presents to the ED with back pain. Patient reports the pain is in the lower back and radiates to the right buttock. The pain started earlier today. Patient reports that she was cooking when the pain started suddenly. No known injury. 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  The history is provided by the patient. No language interpreter was used.  Back Pain   This is a new problem. The problem occurs constantly. The problem has not changed since onset.The pain is associated with no known injury. The pain is present in the lumbar spine. The quality of the pain is described as shooting. Radiates to: right buttock. The pain is at a severity of 10/10. The pain is the same all the time. Pertinent negatives include no chest pain, no fever, no headaches, no abdominal pain, no bowel incontinence, no bladder incontinence, no dysuria and no weakness. She has tried NSAIDs for the symptoms.    Past Medical History:  Diagnosis Date  . Angio-edema   . Arthritis   . Medical history non-contributory   . Shortness of breath    with exertion  . Urticaria     Patient Active Problem List   Diagnosis Date Noted  . Allergic reaction 08/26/2016  . Allergic urticaria 08/26/2016  . Angioedema 08/26/2016  . S/P cervical spinal fusion 03/29/2014    Past Surgical History:  Procedure Laterality Date  . COLONOSCOPY WITH PROPOFOL N/A 06/19/2016   Procedure: COLONOSCOPY WITH PROPOFOL;  Surgeon: Charna Elizabeth, MD;  Location: [facility];  Service: Endoscopy;  Laterality: N/A;  . EYE SURGERY Bilateral    lasik  . HAND SURGERY Right    nerve repair  . POSTERIOR CERVICAL FUSION/FORAMINOTOMY N/A 03/29/2014   Procedure: CERVICAL  TWO THRU CERVICAL SEVEN Posterior Cervical Fusion w/lateral mass fixation, CERVICAL THREE THRU CERVICAL SEVEN Cervical Laminectomy;  Surgeon: Tia Alert, MD;  Location: [facility];  Service: Neurosurgery;  Laterality: N/A;    OB History    No data available       Home Medications    Prior to Admission medications   Medication Sig Start Date End Date Taking? Authorizing Provider  aspirin EC 81 MG tablet Take 81 mg by mouth daily.   Yes [provider]  EPINEPHrine 0.3 mg/0.3 mL IJ SOAJ injection  08/25/16  Yes [provider]  Glucosamine-Chondroitin (GLUCOSAMINE CHONDR COMPLEX PO) Take 1 tablet by mouth daily.   Yes [provider]  ibuprofen (ADVIL,MOTRIN) 800 MG tablet Take 800 mg by mouth daily as needed.   Yes [provider]  Liniments (BEN GAY EX) Apply 1 application topically daily as needed (back ache).   Yes [provider]  Menthol, Topical Analgesic, (ICY HOT EX) Apply 1 application topically daily as needed (back pain).   Yes [provider]  TURMERIC CURCUMIN PO Take 1 tablet by mouth daily.   Yes [provider]  diazepam (VALIUM) 5 MG tablet Take 1 tablet (5 mg total) by mouth 2 (two) times daily. 07/13/17   Janne Napoleon, NP  predniSONE (DELTASONE) 50 MG tablet Starting tomorrow 07/14/17 take one tablet PO daily. 07/13/17   Janne Napoleon, NP  Family History Family History  Problem Relation Age of Onset  . Diabetes Mother   . Hypertension Mother   . Hypertension Father   . Diabetes Father   . Allergic rhinitis Neg Hx   . Asthma Neg Hx   . Eczema Neg Hx   . Urticaria Neg Hx     Social History Social History   Tobacco Use  . Smoking status: Never Smoker  . Smokeless tobacco: Never Used  Substance Use Topics  . Alcohol use: Yes    Comment: socially, maybe once a month  . Drug use: No     Allergies   Penicillins and Sulfa antibiotics   Review of Systems Review of Systems    Constitutional: Negative for fever.  HENT: Negative.   Eyes: Negative for visual disturbance.  Respiratory: Negative for cough and shortness of breath.   Cardiovascular: Negative for chest pain.  Gastrointestinal: Negative for abdominal pain, bowel incontinence, nausea and vomiting.  Genitourinary: Negative for bladder incontinence, dysuria, frequency and urgency.  Musculoskeletal: Positive for arthralgias and back pain.  Skin: Negative for wound.  Neurological: Negative for dizziness, syncope, weakness and headaches.  Psychiatric/Behavioral: Negative for confusion.     Physical Exam Updated Vital Signs BP 128/79 (BP Location: Left Arm)   Pulse 93   Temp 98.6 F (37 C) (Oral)   Resp 18   LMP 06/14/2017   SpO2 98%   Physical Exam  Constitutional: She appears well-developed and well-nourished. No distress.  HENT:  Head: Normocephalic and atraumatic.  Nose: Nose normal.  Eyes: EOM are normal.  Neck: Normal range of motion. Neck supple.  Cardiovascular: Normal rate and regular rhythm.  Pulmonary/Chest: Effort normal. She has no wheezes. She has no rales.  Abdominal: Soft. Bowel sounds are normal. There is no tenderness.  Musculoskeletal: Normal range of motion.       Lumbar back: She exhibits tenderness, pain and spasm. She exhibits normal pulse.  Neurological: She is alert. She has normal strength. No sensory deficit. Abnormal gait: due to pain.  Reflex Scores:      Bicep reflexes are 2+ on the right side and 2+ on the left side.      Brachioradialis reflexes are 2+ on the right side and 2+ on the left side.      Patellar reflexes are 2+ on the right side and 2+ on the left side. No foot drag.  Skin: Skin is warm and dry.  Psychiatric: She has a normal mood and affect. Her behavior is normal.  Nursing note and vitals reviewed.    ED Treatments / Results  Labs (all labs ordered are listed, but only abnormal results are displayed) Labs Reviewed - No data to  display   Radiology Dg Hip Unilat W Or Wo Pelvis 2-3 Views Right  Result Date: 07/13/2017 CLINICAL DATA:  Acute onset of right hip pain. EXAM: DG HIP (WITH OR WITHOUT PELVIS) 2-3V RIGHT COMPARISON:  None. FINDINGS: There is no evidence of fracture or dislocation. Both femoral heads are seated normally within their respective acetabula. The proximal right femur appears intact. Osteophyte formation is noted about the superior acetabulum bilaterally. Slight acetabular sclerosis is noted, without significant joint space narrowing. Mild degenerative change is noted at the lower lumbar spine. The visualized bowel gas pattern is grossly unremarkable in appearance. IMPRESSION: 1. No evidence of fracture or dislocation. 2. Prominent osteophyte formation at the superior acetabulum bilaterally, without significant joint space narrowing. Electronically Signed   By: Beryle BeamsJeffery  Chang M.D.  On: 07/13/2017 22:26    Procedures Procedures (including critical care time)  Medications Ordered in ED Medications  cyclobenzaprine (FLEXERIL) tablet 10 mg (10 mg Oral Given 07/13/17 2124)  diazepam (VALIUM) tablet 5 mg (5 mg Oral Given 07/13/17 2202)  predniSONE (DELTASONE) tablet 60 mg (60 mg Oral Given 07/13/17 2202)     Initial Impression / Assessment and Plan / ED Course  I have reviewed the triage vital signs and the nursing notes. Patient with back pain.  No neurological deficits and normal neuro exam.  Patient can walk but states is painful.  No loss of bowel or bladder control.  No concern for cauda equina.  No fever, night sweats, weight loss, h/o cancer, IVDU.  RICE protocol and pain medicine indicated and discussed with patient. Discussed that muscle relaxer can cause her to be sleepy. F/u with PCP or ortho.   Final Clinical Impressions(s) / ED Diagnoses   Final diagnoses:  Sciatica, right side    ED Discharge Orders        Ordered    diazepam (VALIUM) 5 MG tablet  2 times daily     07/13/17 2330     predniSONE (DELTASONE) 50 MG tablet     07/13/17 2330       Kerrie Buffaloeese, Hope HarveyM, TexasNP 07/13/17 2334    Arby BarrettePfeiffer, Marcy, MD 07/17/17 1754

## 2017-08-18 ENCOUNTER — Telehealth: Payer: Self-pay | Admitting: Allergy and Immunology

## 2017-08-18 NOTE — Telephone Encounter (Signed)
Form is signed and ready. I informed her that she will need to be seen when she returns from her trip so that we can refill any prescriptions she needs. She is only on the epipen. I am faxing the note to her at the requested number.

## 2017-08-18 NOTE — Telephone Encounter (Signed)
Pt leaving on airplane tomorrow and needs a letter to carry her epi-pen with her. [phone] can fax to her [phone].

## 2017-10-05 ENCOUNTER — Other Ambulatory Visit: Payer: Self-pay | Admitting: Family Medicine

## 2017-10-05 ENCOUNTER — Ambulatory Visit
Admission: RE | Admit: 2017-10-05 | Discharge: 2017-10-05 | Disposition: A | Discharge status: Home / Self Care | Payer: 59 | Source: Ambulatory Visit | Attending: Family Medicine | Admitting: Family Medicine

## 2017-10-05 DIAGNOSIS — Z1231 Encounter for screening mammogram for malignant neoplasm of breast: Secondary | ICD-10-CM

## 2018-05-31 ENCOUNTER — Telehealth: Payer: Self-pay | Admitting: Allergy & Immunology

## 2018-05-31 NOTE — Telephone Encounter (Signed)
I talked to the patient who requested a letter stating she has a life threatening allergy and requires keeping an EpiPen on board with her. [place] is flying to [place] for two weeks over Christmas and New Year's. Letter typed out. I will sign it tomorrow and get is scanned and emailed to the patient.  Malachi BondsJoel Henok Heacock, MD [facility] 

## 2018-11-26 ENCOUNTER — Other Ambulatory Visit: Payer: Self-pay | Admitting: Family Medicine

## 2018-11-26 DIAGNOSIS — Z1231 Encounter for screening mammogram for malignant neoplasm of breast: Secondary | ICD-10-CM

## 2018-12-14 ENCOUNTER — Telehealth: Payer: Self-pay | Admitting: Allergy & Immunology

## 2018-12-14 MED ORDER — EPINEPHRINE 0.3 MG/0.3ML IJ SOAJ: 0.3000 mg | INTRAMUSCULAR | 3 refills | Status: AC | PRN

## 2018-12-14 NOTE — Telephone Encounter (Signed)
Patient contacted me requesting an AuviQ refill. Script sent to [facility].   Malachi Bonds, MD [facility]

## 2018-12-25 ENCOUNTER — Ambulatory Visit
Admission: RE | Admit: 2018-12-25 | Discharge: 2018-12-25 | Disposition: A | Discharge status: Home / Self Care | Payer: Self-pay | Source: Ambulatory Visit | Attending: Family Medicine | Admitting: Family Medicine

## 2018-12-25 ENCOUNTER — Other Ambulatory Visit: Payer: Self-pay

## 2018-12-25 DIAGNOSIS — Z1231 Encounter for screening mammogram for malignant neoplasm of breast: Secondary | ICD-10-CM

## 2019-01-17 ENCOUNTER — Ambulatory Visit: Payer: 59

## 2019-11-17 ENCOUNTER — Ambulatory Visit: Payer: No Typology Code available for payment source | Admitting: Sports Medicine

## 2019-11-17 ENCOUNTER — Encounter: Payer: Self-pay | Admitting: Sports Medicine

## 2019-11-17 ENCOUNTER — Ambulatory Visit: Payer: Self-pay

## 2019-11-17 ENCOUNTER — Other Ambulatory Visit: Payer: Self-pay

## 2019-11-17 VITALS — BP 142/82 | Ht 59.0 in | Wt 136.0 lb

## 2019-11-17 DIAGNOSIS — M25511 Pain in right shoulder: Secondary | ICD-10-CM | POA: Diagnosis not present

## 2019-11-17 DIAGNOSIS — G8929 Other chronic pain: Secondary | ICD-10-CM

## 2019-11-17 MED ORDER — AMITRIPTYLINE HCL 10 MG PO TABS
10.0000 mg | ORAL_TABLET | Freq: Every day | ORAL | 1 refills | Status: DC
Start: 2019-11-17 — End: 2019-11-17

## 2019-11-17 MED ORDER — NITROGLYCERIN 0.2 MG/HR TD PT24
MEDICATED_PATCH | TRANSDERMAL | 1 refills | Status: AC
Start: 2019-11-17 — End: ?

## 2019-11-17 MED ORDER — AMITRIPTYLINE HCL 25 MG PO TABS: 25.0000 mg | ORAL_TABLET | Freq: Every day | ORAL | 1 refills | Status: AC

## 2019-11-17 NOTE — Patient Instructions (Addendum)
The pain in your shoulder is likely from adhesive capsulitis.  This is also known as frozen shoulder.  This likely started from a small rotator cuff tear. -Your job will allow you to maintain your rotator cuff strength.  However we would like you to work on your range of motion of the shoulder.  Work on the exercises shown to you at today's visit -We have sent in a prescription for nitroglycerin patches.  Please follow the directions outlined below.  These will help increase the blood flow to the tendon -We have also sent in a prescription for amitriptyline.  You may take this instead of the Flexeril that was prescribed  We will see you back in 4 to 6 weeks.  If you have any questions please not hesitate to contact us   Nitroglycerin Protocol   Apply 1/4 nitroglycerin patch to affected area daily.  Change position of patch within the affected area every 24 hours.  You may experience a headache during the first 1-2 weeks of using the patch, these should subside.  If you experience headaches after beginning nitroglycerin patch treatment, you may take your preferred over the counter pain reliever.  Another side effect of the nitroglycerin patch is skin irritation or rash related to patch adhesive.  Please notify our office if you develop more severe headaches or rash, and stop the patch.  Tendon healing with nitroglycerin patch may require 12 to 24 weeks depending on the extent of injury.  Men should not use if taking Viagra, Cialis, or Levitra.   Do not use if you have migraines or rosacea.

## 2019-11-17 NOTE — Progress Notes (Addendum)
PCP: Laurann Montana, MD  Subjective:   HPI: Patient is a 54 y.o. female here for evaluation of right shoulder pain.  Patient notes the pain is been present for last several months.  She denies any specific injury or trauma.  The pain is located in the lateral aspect of her shoulder.  The pain does not radiate.  Of note she does have a history of cervical stenosis status post laminectomy and fusion.  Patient does have pain reaching her arm above her head or behind her back.  Patient was seen by Cathie Olden who obtained an MRI showing a full thickness tear of the supraspinatus measuring 1.2 cm with less than 8 mm retraction.  Patient was offered surgery however she declined this and wanted a second opinion to see if there are any nonsurgical options for her.  Patient was given a prescription for muscle relaxers which have helped her pain slightly. She took a dosepack which settled down the worst pain.  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   Review of Systems:  No radicular pain Neck stiffness Tightness with neck and shoulder rotation  Past Medical History:  Diagnosis Date  . Angio-edema   . Arthritis   . Medical history non-contributory   . Shortness of breath    with exertion  . Urticaria     Current Outpatient Medications on File Prior to Visit  Medication Sig Dispense Refill  . aspirin EC 81 MG tablet Take 81 mg by mouth daily.    . diazepam (VALIUM) 5 MG tablet Take 1 tablet (5 mg total) by mouth 2 (two) times daily. 10 tablet 0  . EPINEPHrine (AUVI-Q) 0.3 mg/0.3 mL IJ SOAJ injection Inject 0.3 mLs (0.3 mg total) into the muscle as needed for anaphylaxis. 2 Device 3  . Glucosamine-Chondroitin (GLUCOSAMINE CHONDR COMPLEX PO) Take 1 tablet by mouth daily.    Marland Kitchen ibuprofen (ADVIL,MOTRIN) 800 MG tablet Take 800 mg by mouth daily as needed.    . Liniments (BEN GAY EX) Apply 1 application topically daily as needed (back ache).    .  Menthol, Topical Analgesic, (ICY HOT EX) Apply 1 application topically daily as needed (back pain).    . predniSONE (DELTASONE) 50 MG tablet Starting tomorrow 07/14/17 take one tablet PO daily. 4 tablet 0  . TURMERIC CURCUMIN PO Take 1 tablet by mouth daily.     No current facility-administered medications on file prior to visit.    Past Surgical History:  Procedure Laterality Date  . COLONOSCOPY WITH PROPOFOL N/A 06/19/2016   Procedure: COLONOSCOPY WITH PROPOFOL;  Surgeon: Charna Elizabeth, MD;  Location: [facility];  Service: Endoscopy;  Laterality: N/A;  . EYE SURGERY Bilateral    lasik  . HAND SURGERY Right    nerve repair  . POSTERIOR CERVICAL FUSION/FORAMINOTOMY N/A 03/29/2014   Procedure: CERVICAL TWO THRU CERVICAL SEVEN Posterior Cervical Fusion w/lateral mass fixation, CERVICAL THREE THRU CERVICAL SEVEN Cervical Laminectomy;  Surgeon: Tia Alert, MD;  Location: [facility];  Service: Neurosurgery;  Laterality: N/A;    Allergies  Allergen Reactions  . Penicillins Rash    Unknown reaction as a child. Has patient had a PCN reaction causing immediate rash, facial/tongue/throat swelling, SOB or lightheadedness with hypotension: YES Has patient had a PCN reaction causing severe rash involving mucus membranes or skin necrosis: Unknown Has patient had a PCN reaction that required hospitalization: Unknown Has patient had a PCN reaction occurring within the  last 10 years: No If all of the above answers are "NO", then may proceed with Cephalosporin use.   . Sulfa Antibiotics Rash    Social History   Socioeconomic History  . Marital status: Divorced    Spouse name: Not on file  . Number of children: Not on file  . Years of education: Not on file  . Highest education level: Not on file  Occupational History  . Not on file  Tobacco Use  . Smoking status: Never Smoker  . Smokeless tobacco: Never Used  Substance and Sexual Activity  . Alcohol use: Yes    Comment: socially, maybe  once a month  . Drug use: No  . Sexual activity: Not on file  Other Topics Concern  . Not on file  Social History Narrative  . Not on file   Social Determinants of Health   Financial Resource Strain:   . Difficulty of Paying Living Expenses:   Food Insecurity:   . Worried About Charity fundraiser in the Last Year:   . Arboriculturist in the Last Year:   Transportation Needs:   . Film/video editor (Medical):   Marland Kitchen Lack of Transportation (Non-Medical):   Physical Activity:   . Days of Exercise per Week:   . Minutes of Exercise per Session:   Stress:   . Feeling of Stress :   Social Connections:   . Frequency of Communication with Friends and Family:   . Frequency of Social Gatherings with Friends and Family:   . Attends Religious Services:   . Active Member of Clubs or Organizations:   . Attends Archivist Meetings:   Marland Kitchen Marital Status:   Intimate Partner Violence:   . Fear of Current or Ex-Partner:   . Emotionally Abused:   Marland Kitchen Physically Abused:   . Sexually Abused:     Family History  Problem Relation Age of Onset  . Diabetes Mother   . Hypertension Mother   . Hypertension Father   . Diabetes Father   . Allergic rhinitis Neg Hx   . Asthma Neg Hx   . Eczema Neg Hx   . Urticaria Neg Hx         Objective:  Physical Exam: LMP 09/23/2017 (Approximate)  Gen: NAD, comfortable in exam room Lungs: Breathing comfortably on room air Shoulder Exam right -Inspection: No discoloration, no deformity -Palpation: No tenderness to palpation -ROM (active): Abduction: 160 degrees; Forward Flexion: 160 degrees; Internal Rotation: Unable to reach behind her back.  Patient with limited external rotation bilaterally -Strength: Abduction: 5/5; Forward Flexion: 5/5; Internal Rotation: 5/5; External Rotation: 5/5 -Special Tests: Hawkins: Positive; Neers: Negative; Jobs: Positive; O'briens: Negative; Speeds: Negative; Yergasons: Negative; Cross arm: negative;  Apprehension: Negative -Limb neurovascularly intact, no instability noted  + frozen shoulder test Rt> Lt   Diagnostic ultrasound of the right shoulder Findings: -Normal appearance of the biceps tendon in short axis.  Small amount of fluid noted surrounding the biceps tendon sheath in the long axis -Normal appearance of the subscapularis -Small focal tear on the articular aspect of the distal supraspinatus.  Dynamic ultrasound did not show any gapping or separation of the supraspinatus from the humeral head -Normal appearance of the infraspinatus and teres minor -Geyser sign seen at the [facility] joint Impression: -Tear of the supraspinatus as well as arthritic changes and effusion seen at the [facility] joint. Probable thickness of capsule with hyperechoic change  Ultrasound and interpretation by dr. Sheppard Coil and Wolfgang Phoenix.  Fields, MD    Assessment & Plan:  Patient is a 54 y.o. female here for evaluation of right shoulder pain next  1.  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  She will stop taking the Flexeril -Patient given range of motion exercises.  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  Patient will follow up in 4 weeks  I observed and examined the patient with the Dr. Lyn Hollingshead and agree with assessment and plan.  Note reviewed and modified by me.  Sterling Big, MD

## 2019-11-22 ENCOUNTER — Other Ambulatory Visit: Payer: Self-pay | Admitting: Family Medicine

## 2019-11-22 DIAGNOSIS — Z1231 Encounter for screening mammogram for malignant neoplasm of breast: Secondary | ICD-10-CM

## 2019-12-30 ENCOUNTER — Ambulatory Visit
Admission: RE | Admit: 2019-12-30 | Discharge: 2019-12-30 | Disposition: A | Discharge status: Home / Self Care | Payer: No Typology Code available for payment source | Source: Ambulatory Visit | Attending: Family Medicine | Admitting: Family Medicine

## 2019-12-30 ENCOUNTER — Other Ambulatory Visit: Payer: Self-pay

## 2019-12-30 DIAGNOSIS — Z1231 Encounter for screening mammogram for malignant neoplasm of breast: Secondary | ICD-10-CM

## 2021-01-29 ENCOUNTER — Other Ambulatory Visit: Payer: Self-pay | Admitting: Family Medicine

## 2021-01-29 DIAGNOSIS — Z1231 Encounter for screening mammogram for malignant neoplasm of breast: Secondary | ICD-10-CM

## 2021-01-30 ENCOUNTER — Other Ambulatory Visit: Payer: Self-pay

## 2021-01-30 ENCOUNTER — Ambulatory Visit
Admission: RE | Admit: 2021-01-30 | Discharge: 2021-01-30 | Disposition: A | Discharge status: Home / Self Care | Payer: BC Managed Care – PPO | Source: Ambulatory Visit | Attending: Family Medicine | Admitting: Family Medicine

## 2021-01-30 ENCOUNTER — Other Ambulatory Visit: Payer: Self-pay | Admitting: Family Medicine

## 2021-01-30 DIAGNOSIS — Z1231 Encounter for screening mammogram for malignant neoplasm of breast: Secondary | ICD-10-CM

## 2022-01-01 DIAGNOSIS — Z13220 Encounter for screening for lipoid disorders: Secondary | ICD-10-CM | POA: Diagnosis not present

## 2022-01-01 DIAGNOSIS — Z1159 Encounter for screening for other viral diseases: Secondary | ICD-10-CM | POA: Diagnosis not present

## 2022-01-01 DIAGNOSIS — Z789 Other specified health status: Secondary | ICD-10-CM | POA: Diagnosis not present

## 2022-01-01 DIAGNOSIS — R7301 Impaired fasting glucose: Secondary | ICD-10-CM | POA: Diagnosis not present

## 2022-01-01 DIAGNOSIS — Z124 Encounter for screening for malignant neoplasm of cervix: Secondary | ICD-10-CM | POA: Diagnosis not present

## 2022-01-01 DIAGNOSIS — Z0000 Encounter for general adult medical examination without abnormal findings: Secondary | ICD-10-CM | POA: Diagnosis not present

## 2022-01-01 DIAGNOSIS — D649 Anemia, unspecified: Secondary | ICD-10-CM | POA: Diagnosis not present

## 2022-01-01 DIAGNOSIS — R5383 Other fatigue: Secondary | ICD-10-CM | POA: Diagnosis not present

## 2022-01-31 ENCOUNTER — Other Ambulatory Visit: Payer: Self-pay | Admitting: Family Medicine

## 2022-01-31 DIAGNOSIS — Z1231 Encounter for screening mammogram for malignant neoplasm of breast: Secondary | ICD-10-CM

## 2022-02-06 ENCOUNTER — Ambulatory Visit
Admission: RE | Admit: 2022-02-06 | Discharge: 2022-02-06 | Disposition: A | Discharge status: Home / Self Care | Payer: No Typology Code available for payment source | Source: Ambulatory Visit | Attending: Family Medicine | Admitting: Family Medicine

## 2022-02-06 DIAGNOSIS — Z1231 Encounter for screening mammogram for malignant neoplasm of breast: Secondary | ICD-10-CM | POA: Diagnosis not present

## 2022-05-08 IMAGING — MG MM DIGITAL SCREENING BILAT W/ TOMO AND CAD
8 series · 8 of 24 positions shown · non-contrast
Comparison: Previous exam(s).

CLINICAL DATA: Screening.

EXAM:
DIGITAL SCREENING BILATERAL MAMMOGRAM WITH TOMOSYNTHESIS AND CAD
TECHNIQUE: Bilateral screening digital craniocaudal and mediolateral oblique
mammograms were obtained. Bilateral screening digital breast
tomosynthesis was performed. The images were evaluated with
computer-aided detection.

[L MLO synth-2D]
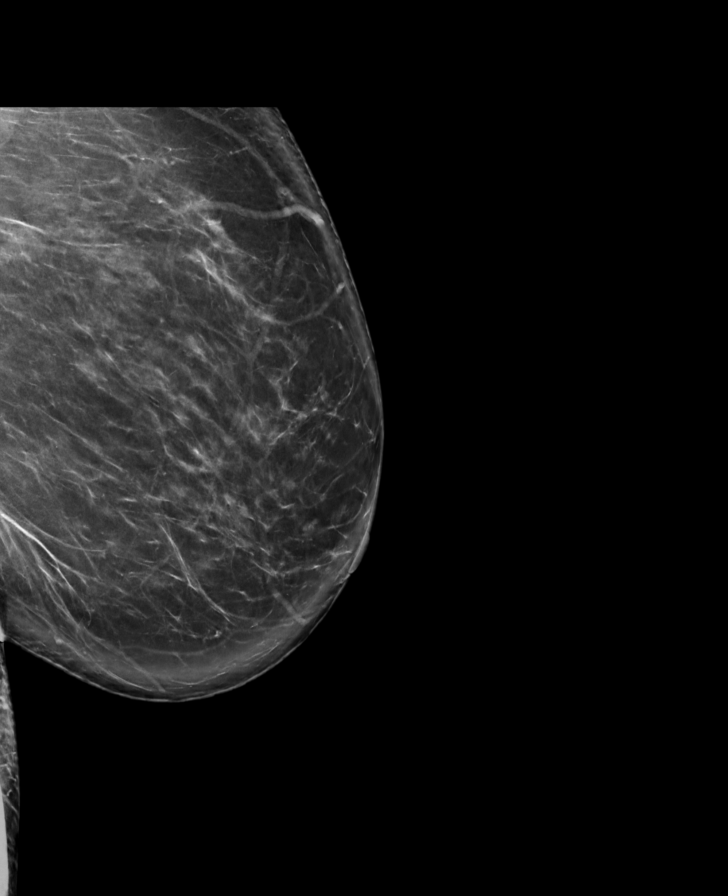

[L CC synth-2D]
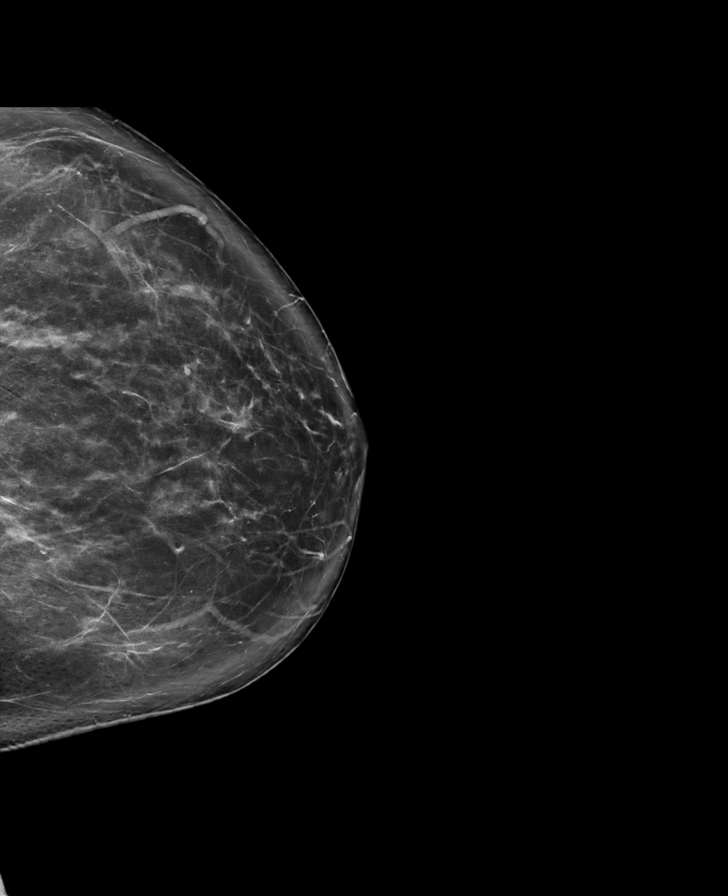

[R CC synth-2D]
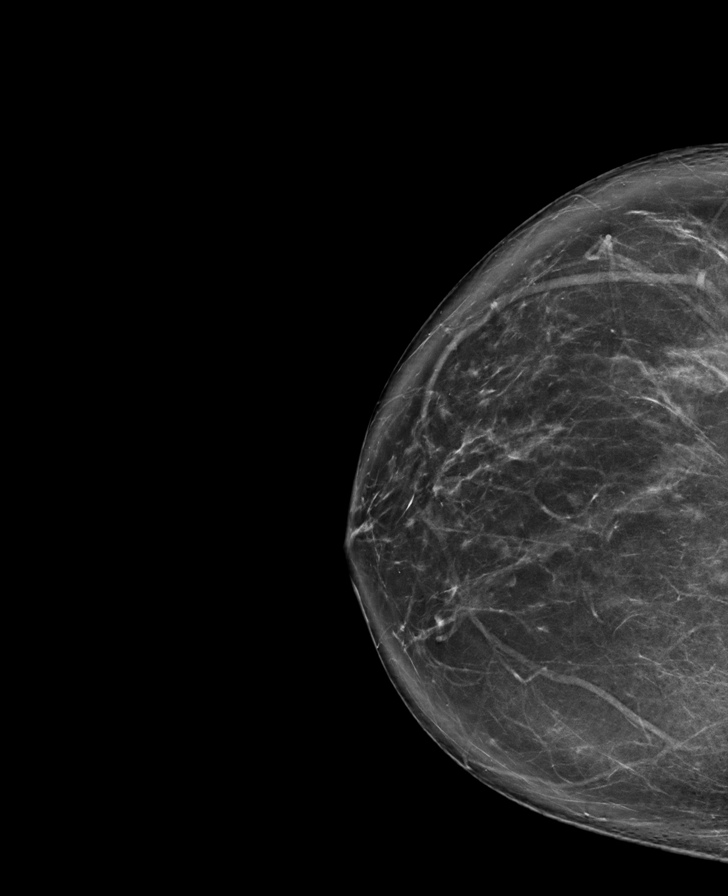

[R MLO synth-2D]
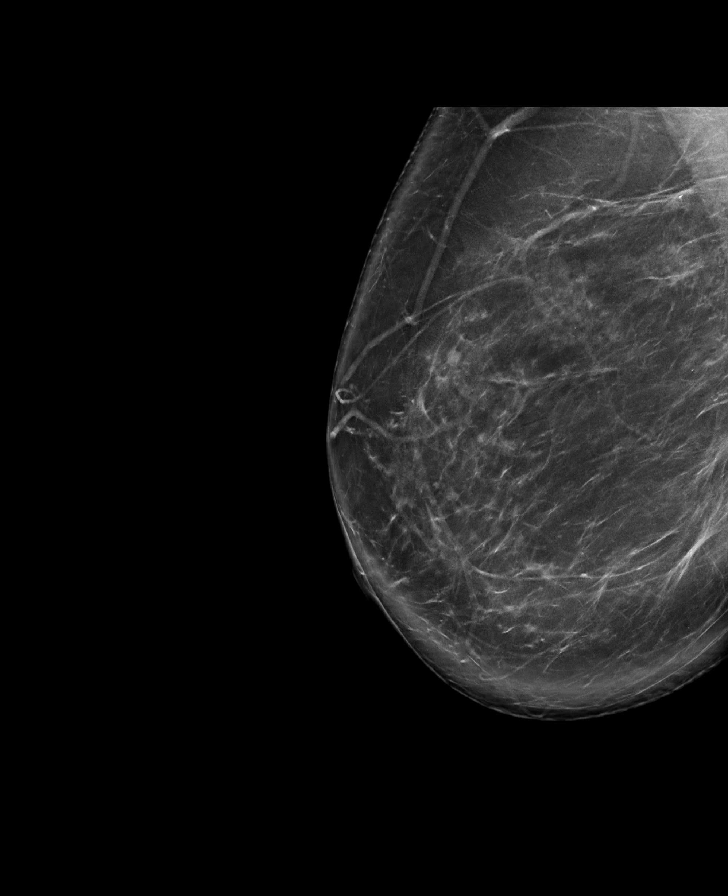

[R MLO tomo · tomo slice 55/108.0]
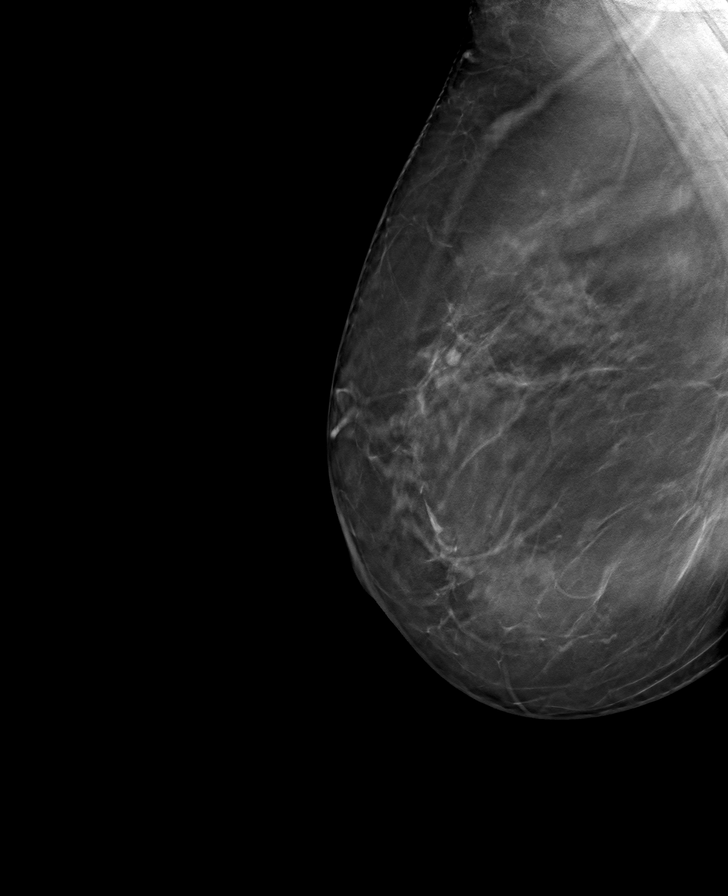

[L CC tomo · tomo slice 47/93.0]
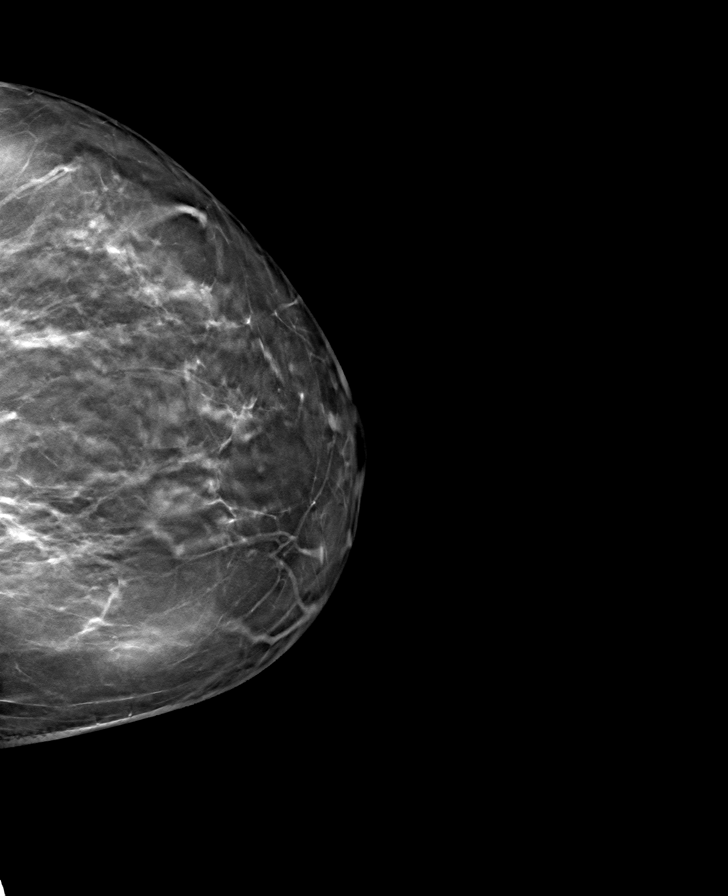

[R CC tomo · tomo slice 47/94.0]
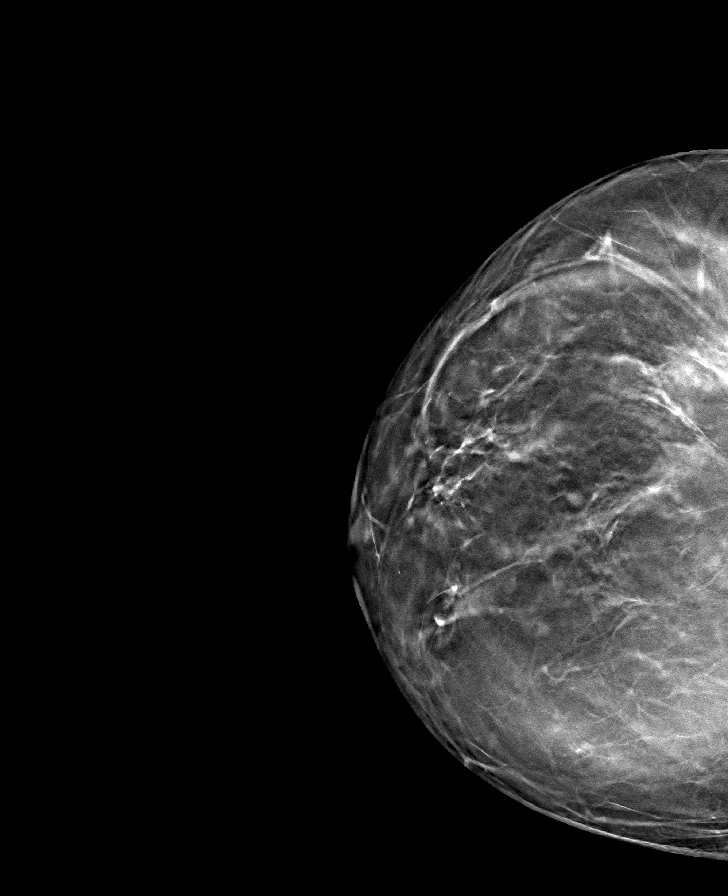

[L MLO tomo · tomo slice 51/102.0]
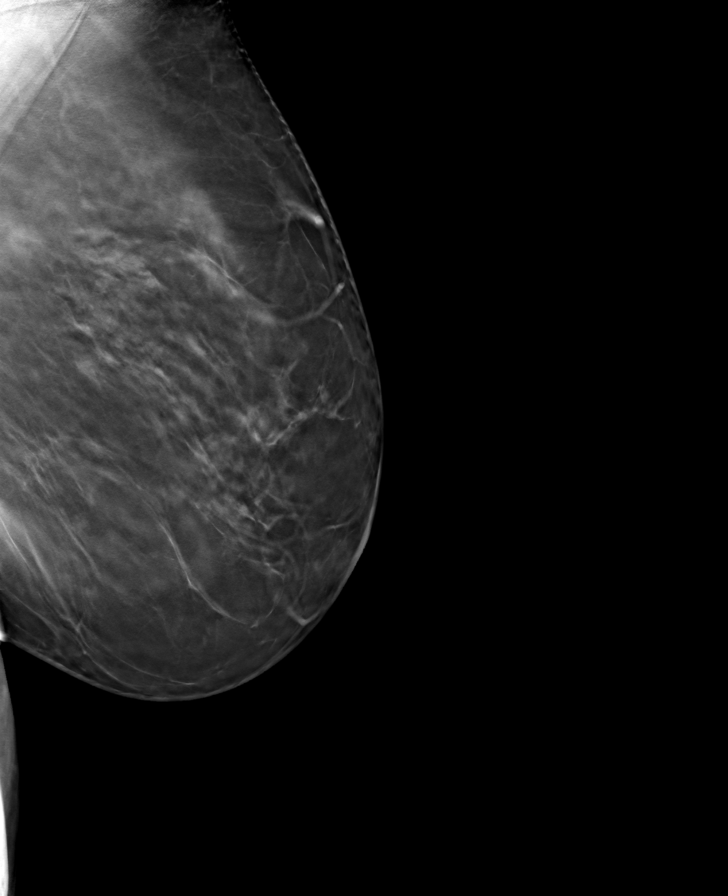

[8 of 24 positions shown; findings below may reference images not displayed]

ACR Breast Density Category b: There are scattered areas of
fibroglandular density.
FINDINGS: There are no findings suspicious for malignancy.
IMPRESSION: No mammographic evidence of malignancy. A result letter of this
screening mammogram will be mailed directly to the patient.

RECOMMENDATION:
Screening mammogram in one year. (Code:51-O-LD2)

BI-RADS CATEGORY  1: Negative.

## 2022-09-24 DIAGNOSIS — Z809 Family history of malignant neoplasm, unspecified: Secondary | ICD-10-CM | POA: Diagnosis not present

## 2022-09-24 DIAGNOSIS — Z880 Allergy status to penicillin: Secondary | ICD-10-CM | POA: Diagnosis not present

## 2022-09-24 DIAGNOSIS — R030 Elevated blood-pressure reading, without diagnosis of hypertension: Secondary | ICD-10-CM | POA: Diagnosis not present

## 2022-09-24 DIAGNOSIS — Z833 Family history of diabetes mellitus: Secondary | ICD-10-CM | POA: Diagnosis not present

## 2022-09-24 DIAGNOSIS — M1990 Unspecified osteoarthritis, unspecified site: Secondary | ICD-10-CM | POA: Diagnosis not present

## 2022-09-24 DIAGNOSIS — Z8249 Family history of ischemic heart disease and other diseases of the circulatory system: Secondary | ICD-10-CM | POA: Diagnosis not present

## 2022-09-24 DIAGNOSIS — Z882 Allergy status to sulfonamides status: Secondary | ICD-10-CM | POA: Diagnosis not present

## 2022-09-24 DIAGNOSIS — Z823 Family history of stroke: Secondary | ICD-10-CM | POA: Diagnosis not present

## 2023-01-09 ENCOUNTER — Other Ambulatory Visit: Payer: Self-pay | Admitting: Family Medicine

## 2023-01-09 DIAGNOSIS — Z1231 Encounter for screening mammogram for malignant neoplasm of breast: Secondary | ICD-10-CM

## 2023-01-30 DIAGNOSIS — Z789 Other specified health status: Secondary | ICD-10-CM | POA: Diagnosis not present

## 2023-01-30 DIAGNOSIS — Z13220 Encounter for screening for lipoid disorders: Secondary | ICD-10-CM | POA: Diagnosis not present

## 2023-01-30 DIAGNOSIS — Z1159 Encounter for screening for other viral diseases: Secondary | ICD-10-CM | POA: Diagnosis not present

## 2023-01-30 DIAGNOSIS — Z0000 Encounter for general adult medical examination without abnormal findings: Secondary | ICD-10-CM | POA: Diagnosis not present

## 2023-01-30 DIAGNOSIS — R7303 Prediabetes: Secondary | ICD-10-CM | POA: Diagnosis not present

## 2023-01-30 DIAGNOSIS — D509 Iron deficiency anemia, unspecified: Secondary | ICD-10-CM | POA: Diagnosis not present

## 2023-02-10 ENCOUNTER — Ambulatory Visit
Admission: RE | Admit: 2023-02-10 | Discharge: 2023-02-10 | Disposition: A | Discharge status: Home / Self Care | Payer: No Typology Code available for payment source | Source: Ambulatory Visit | Attending: Family Medicine | Admitting: Family Medicine

## 2023-02-10 DIAGNOSIS — Z1231 Encounter for screening mammogram for malignant neoplasm of breast: Secondary | ICD-10-CM

## 2023-02-12 ENCOUNTER — Other Ambulatory Visit: Payer: Self-pay | Admitting: Family Medicine

## 2023-02-12 DIAGNOSIS — R928 Other abnormal and inconclusive findings on diagnostic imaging of breast: Secondary | ICD-10-CM

## 2023-02-20 ENCOUNTER — Ambulatory Visit
Admission: RE | Admit: 2023-02-20 | Discharge: 2023-02-20 | Disposition: A | Discharge status: Home / Self Care | Payer: No Typology Code available for payment source | Source: Ambulatory Visit | Attending: Family Medicine | Admitting: Family Medicine

## 2023-02-20 ENCOUNTER — Other Ambulatory Visit: Payer: Self-pay | Admitting: Family Medicine

## 2023-02-20 ENCOUNTER — Ambulatory Visit
Admission: RE | Admit: 2023-02-20 | Discharge: 2023-02-20 | Disposition: A | Discharge status: Home / Self Care | Payer: No Typology Code available for payment source | Source: Ambulatory Visit | Attending: Family Medicine

## 2023-02-20 DIAGNOSIS — R928 Other abnormal and inconclusive findings on diagnostic imaging of breast: Secondary | ICD-10-CM

## 2023-02-20 DIAGNOSIS — N6311 Unspecified lump in the right breast, upper outer quadrant: Secondary | ICD-10-CM | POA: Diagnosis not present

## 2023-02-20 DIAGNOSIS — N6489 Other specified disorders of breast: Secondary | ICD-10-CM

## 2023-02-20 DIAGNOSIS — N6315 Unspecified lump in the right breast, overlapping quadrants: Secondary | ICD-10-CM | POA: Diagnosis not present

## 2023-02-20 DIAGNOSIS — N6310 Unspecified lump in the right breast, unspecified quadrant: Secondary | ICD-10-CM

## 2024-02-26 ENCOUNTER — Other Ambulatory Visit: Payer: Self-pay | Admitting: Family Medicine

## 2024-02-26 DIAGNOSIS — N6489 Other specified disorders of breast: Secondary | ICD-10-CM

## 2024-03-11 ENCOUNTER — Ambulatory Visit
Admission: RE | Admit: 2024-03-11 | Discharge: 2024-03-11 | Disposition: A | Discharge status: Home / Self Care | Source: Ambulatory Visit | Attending: Family Medicine | Admitting: Family Medicine

## 2024-03-11 DIAGNOSIS — N6489 Other specified disorders of breast: Secondary | ICD-10-CM
